# Patient Record
Sex: Male | Born: 1960 | Race: White | Hispanic: No | Marital: Married | Smoking: Never smoker
Health system: Southern US, Community
[De-identification: ages and names within clinical notes are randomized; demographics above are authoritative.]

## PROBLEM LIST (undated history)

## (undated) DIAGNOSIS — Z8719 Personal history of other diseases of the digestive system: Secondary | ICD-10-CM

## (undated) DIAGNOSIS — K219 Gastro-esophageal reflux disease without esophagitis: Secondary | ICD-10-CM

## (undated) DIAGNOSIS — T7840XA Allergy, unspecified, initial encounter: Secondary | ICD-10-CM

## (undated) DIAGNOSIS — G4730 Sleep apnea, unspecified: Secondary | ICD-10-CM

## (undated) HISTORY — DX: Allergy, unspecified, initial encounter: T78.40XA

---

## 1998-03-29 ENCOUNTER — Encounter: Admission: RE | Admit: 1998-03-29 | Discharge: 1998-06-27 | Payer: Self-pay | Admitting: Internal Medicine

## 1998-04-26 ENCOUNTER — Ambulatory Visit (HOSPITAL_COMMUNITY): Admission: RE | Admit: 1998-04-26 | Discharge: 1998-04-26 | Payer: Self-pay | Admitting: *Deleted

## 2000-05-02 ENCOUNTER — Encounter (INDEPENDENT_AMBULATORY_CARE_PROVIDER_SITE_OTHER): Payer: Self-pay | Admitting: Specialist

## 2000-05-02 ENCOUNTER — Encounter: Payer: Self-pay | Admitting: *Deleted

## 2000-05-02 ENCOUNTER — Ambulatory Visit (HOSPITAL_COMMUNITY): Admission: RE | Admit: 2000-05-02 | Discharge: 2000-05-02 | Payer: Self-pay | Admitting: *Deleted

## 2005-05-11 ENCOUNTER — Ambulatory Visit (HOSPITAL_BASED_OUTPATIENT_CLINIC_OR_DEPARTMENT_OTHER): Admission: RE | Admit: 2005-05-11 | Discharge: 2005-05-11 | Payer: Self-pay | Admitting: Family Medicine

## 2005-05-14 ENCOUNTER — Ambulatory Visit: Payer: Self-pay | Admitting: Internal Medicine

## 2005-07-17 ENCOUNTER — Ambulatory Visit (HOSPITAL_COMMUNITY): Admission: RE | Admit: 2005-07-17 | Discharge: 2005-07-17 | Payer: Self-pay | Admitting: *Deleted

## 2005-08-02 ENCOUNTER — Encounter: Admission: RE | Admit: 2005-08-02 | Discharge: 2005-08-02 | Payer: Self-pay | Admitting: *Deleted

## 2005-08-04 ENCOUNTER — Ambulatory Visit (HOSPITAL_COMMUNITY): Admission: RE | Admit: 2005-08-04 | Discharge: 2005-08-04 | Payer: Self-pay | Admitting: *Deleted

## 2005-08-17 ENCOUNTER — Ambulatory Visit (HOSPITAL_COMMUNITY): Admission: RE | Admit: 2005-08-17 | Discharge: 2005-08-17 | Payer: Self-pay | Admitting: Obstetrics and Gynecology

## 2005-08-18 ENCOUNTER — Encounter (INDEPENDENT_AMBULATORY_CARE_PROVIDER_SITE_OTHER): Payer: Self-pay | Admitting: *Deleted

## 2006-01-16 HISTORY — PX: LAPAROSCOPIC GASTRIC BANDING: SHX1100

## 2006-04-19 ENCOUNTER — Encounter: Admission: RE | Admit: 2006-04-19 | Discharge: 2006-07-18 | Payer: Self-pay | Admitting: *Deleted

## 2006-05-07 ENCOUNTER — Ambulatory Visit (HOSPITAL_COMMUNITY): Admission: RE | Admit: 2006-05-07 | Discharge: 2006-05-08 | Payer: Self-pay | Admitting: *Deleted

## 2007-12-25 ENCOUNTER — Encounter: Admission: RE | Admit: 2007-12-25 | Discharge: 2007-12-25 | Payer: Self-pay | Admitting: *Deleted

## 2009-10-22 ENCOUNTER — Encounter: Admission: RE | Admit: 2009-10-22 | Discharge: 2009-10-22 | Payer: Self-pay | Admitting: Surgery

## 2009-11-16 ENCOUNTER — Ambulatory Visit (HOSPITAL_COMMUNITY): Admission: RE | Admit: 2009-11-16 | Discharge: 2009-11-16 | Payer: Self-pay | Admitting: Surgery

## 2010-06-03 NOTE — Op Note (Signed)
[facility]  Patient:    Kerry Gomez, Kerry Gomez                      MRN: [id number] Proc. Date: 05/02/00 Adm. Date:  [id number] Attending:  Sabino Gasser                           Operative Report  PROCEDURE:  Upper endoscopy with dilation and biopsy.  INDICATIONS FOR PROCEDURE:  Dysphagia.  ANESTHESIA:  Demerol 80, Versed 8 mg.  DESCRIPTION OF PROCEDURE:  With the patient mildly sedated in the left lateral decubitus position, the Olympus videoscopic endoscope was inserted in the mouth, passed under direct vision through the esophagus which appeared normal. The distal esophagus showed a hiatal hernia, question of short segment Barretts esophagus seen, photographed and biopsied. We entered into the stomach, the fundus, body, antrum, duodenal bulb and second portion of the duodenum were all well visualized and appeared normal. From this point, the endoscope was slowly withdrawn taking circumferential views of the entire duodenal mucosa until the endoscope was then pulled back in the stomach and placed in retroflexion to view the stomach from below and this too appeared normal. The endoscope was then straightened and withdrawn after placing a guidewire under fluoroscopic control. Once the endoscope was withdrawn, Savary dilators of 16 and 19 were passed with some resistance on 19 mm dilator and a minimal amount of blood seen on the dilator upon removal. With this, the endoscope was reinserted. The stomach, body, antrum, and fundus all appeared normal. Photographs taken. The endoscope was withdrawn to the esophagus. The distal esophagus was biopsied in the area of question of Barretts and the endoscope was withdrawn taking circumferential views of the remaining esophageal mucosa which otherwise appeared normal. The patients vital signs and pulse oximeter remained stable. The patient tolerated the procedure well without apparent complications.  FINDINGS:  Question  of Barretts esophagus biopsied, dilation of distal esophagus to 19 Savary dilator.  PLAN:  Await clinical response. Continue Prilosec therapy at this time and have the patient followup with me in as an outpatient and call me for results of biopsies. DD:  05/02/00 TD:  05/02/00 Job: [id number]

## 2010-06-03 NOTE — Op Note (Signed)
NAMEGRABIEL, SCHMUTZ NO.:  [id number]   MEDICAL RECORD NO.:  [id number]          PATIENT TYPE:  OIB   LOCATION:  1610                         FACILITY:  [facility]   PHYSICIAN:  Alfonse Ras, MD   DATE OF BIRTH:  January 01, 1961   DATE OF PROCEDURE:  05/07/2006  DATE OF DISCHARGE:                               OPERATIVE REPORT   PREOPERATIVE DIAGNOSIS:  Morbid obesity, medically refractory.   POSTOPERATIVE DIAGNOSIS:  Morbid obesity, medically refractory.   PROCEDURE:  Laparoscopic adjustable gastric bending with APS system and  additional ren plication.   ANESTHESIA:  General.   SURGEON:  Baruch Merl, M.D.   ASSISTANT:  [name], M.D.   DESCRIPTION:  Patient was taken to the operating room, placed in the  supine position.  After adequate anesthesia was induced using  endotracheal tube, the abdomen was prepped and draped in normal sterile  fashion.  Using left upper quadrant access, through an 11 mm trocar  under direct vision peritoneal access was obtained.  Additional 12 and  15 mm trocars were placed in the right abdomen.  A 5 mm trocar was  placed in the subxiphoid region and the liver was retracted anteriorly.  The angle of His was dissected and using the band passer in a pars  flaccida technique it was passed behind the stomach.  An APS system was  primed and then placed and closed over a sizing tube.  Plication was  performed using #2-0 Ethibond sutures.  An additional two stitches were  placed in the fundus of the stomach.  Adequate hemostasis was ensured,  the band was in good position.  I then pulled the tubing out and it was  connected to the port and attached to the anterior abdominal fascia at  the rectus level.  Skin incisions were closed with subcuticular Vicryl  and then staples.  The patient tolerated procedure well and went to PACU  in good condition.      Alfonse Ras, MD  Electronically Signed     KRE/MEDQ  D:  05/07/2006   T:  05/07/2006  Job:  [id number]

## 2010-06-03 NOTE — Op Note (Signed)
Kerry Gomez, Kerry Gomez               ACCOUNT NO.:  [id number]   MEDICAL RECORD NO.:  [id number]          PATIENT TYPE:  AMB   LOCATION:  ENDO                         FACILITY:  [facility]   PHYSICIAN:  Sandria Bales. Ezzard Standing, M.D.  DATE OF BIRTH:  10-22-1960   DATE OF PROCEDURE:  08/17/2005  DATE OF DISCHARGE:                                 OPERATIVE REPORT   SURGEON:  Sandria Bales. Ezzard Standing, M.D.   PREOPERATIVE DIAGNOSIS:  History of reflux, anticipate Lap-Band procedure.   POSTOPERATIVE DIAGNOSIS:  Gastric polyp, approximately 1 cm in size; minimal  changes of reflux, with normal gastroesophageal junction at about 40 cm.   PROCEDURE:  Upper esophagogastroduodenoscopy.   ANESTHESIA:  Demerol 50 mg, Versed 4 mg.   COMPLICATIONS:  None.   INDICATIONS FOR PROCEDURE:  Kerry Gomez is a 50 year old white male, who comes  for upper endoscopy in anticipation of a Lap-Band procedure.  His wife has  actually had a successful Lap-Band with over 90 pounds of weight loss.   The indications and potential complications of the procedure were explained  to the patient.  Yesterday he had a prior upper endoscopy, so he understands  what goes on.   DESCRIPTION OF PROCEDURE:  The back of his throat was anesthetized with  Cetacaine spray 4 times.  I then gave him 50 mg Demerol, 4 mg Versed.  A  flexible Olympus GIF-140 endoscope was passed without difficulty down his  esophagus into his stomach.  I came into the duodenum to the second portion.  There were no ulcer and no inflammation.  He had normal mucosa of his first  and second portions of his duodenum.  His pylorus was normal with normal  opening pattern.  I retroflexed the scope back to his GE junction, which was  unremarkable.  At about 50 cm from the incisors along the greater curvature,  he had a 1 to 1.2-cm polyp, which was somewhat pedunculated.  I got the hot  biopsy forceps.  I coagulated this thing and took at last 3 biopsies.  I  sent it to  pathology.   There was no bleeding at the end of the biopsy of the polyp. I then withdrew  the scope to the GE junction, which was right at about 40 cm.  He had really  no evidence of distal esophagitis.  His Z line may be minimally irregular,  but he had minimal evidence of reflux.  He did, interestingly, have bile in  his stomach.  Again, I do not know what this meant in a person who is n.p.o.   He tolerated the procedure well.  I spoke to his wife and his parents after  the procedure.  He will be in touch with Dr. Colin Benton next week for final  followup of his pathology.      Sandria Bales. Ezzard Standing, M.D.  Electronically Signed     DHN/MEDQ  D:  08/17/2005  T:  08/18/2005  Job:  [id number]   cc:   Dr Marisue Humble Jake Shark, MD

## 2010-06-03 NOTE — Procedures (Signed)
Kerry Gomez, Kerry Gomez NO.:  [id number]   MEDICAL RECORD NO.:  [id number]          PATIENT TYPE:  OUT   LOCATION:  SLEEP CENTER                 FACILITY:  [facility]   PHYSICIAN:  Clinton D. Maple Hudson, M.D. DATE OF BIRTH:  Jun 04, 1960   DATE OF STUDY:                              NOCTURNAL POLYSOMNOGRAM   INDICATION FOR STUDY:  Hypersomnia with sleep apnea.   EPWORTH SLEEPINESS SCORE:  9/24.   BMI 38.  Weight 285 pounds.   MEDICATIONS:  AcipHex, Allegra, Flonase, Benadryl, aspirin.   SLEEP ARCHITECTURE:  Total sleep time 308 minutes with sleep efficiency 68%.  Stage I was 17%, stage II 73%, stages III and four were absent, REM 10% of  total sleep time.  Sleep latency 13 minutes, REM latency 274 minutes, awake  after sleep onset 115 minutes, arousal index 115 which is markedly increased  indicating significant sleep fragmentation.  No bedtime medication taken.   RESPIRATORY DATA:  Split study protocol.  Apnea/hypopnea index (AHI, RDI)  129.6 obstructive events per hour indicating severe obstructive sleep  apnea/hypopnea syndrome before CPAP.  This included 137 obstructive apneas  and 159 hypopneas before CPAP.  Events were not positional.  REM AHI 16.3  per hour.  CPAP was titrated to 10 CWP, AHI 1.8 per hour.  A medium ResMed  Ultra Mirage nasal oral mask was used with heated humidifier.   OXYGEN DATA:  Moderate snoring with oxygen desaturation to a nadir of 86%  before CPAP.  After CPAP control, saturation at 93-95% on room air.   CARDIAC DATA:  Normal sinus rhythm.   MOVEMENT-PARASOMNIA:  A total of 33 limb jerks were reported of which 25  were associated with arousal or awakening for a periodic limb movement with  arousal index of 4.9 per hour, which is mildly increased.   IMPRESSIONS-RECOMMENDATIONS:  1.  Very severe obstructive sleep apnea/hypopnea syndrome, AHI 1.29.6 per      hour with nonpositional events, moderate snoring and oxygen desaturation      to  86%.  2.  Successful CPAP titration to 10 CWP, AHI 1.8 per hour.A medium ResMed      Ultra Mirage nasal oral mask was      used with heated humidifier.  3.  Periodic limb movement with arousal, 4.9 per hour.      Clinton D. Maple Hudson, M.D.  Diplomate, Biomedical engineer of Sleep Medicine  Electronically Signed     CDY/MEDQ  D:  05/14/2005 12:56:45  T:  05/15/2005 11:09:32  Job:  [id number]

## 2010-09-02 ENCOUNTER — Encounter (INDEPENDENT_AMBULATORY_CARE_PROVIDER_SITE_OTHER): Payer: Self-pay

## 2010-09-02 ENCOUNTER — Ambulatory Visit (INDEPENDENT_AMBULATORY_CARE_PROVIDER_SITE_OTHER): Payer: 59 | Admitting: Physician Assistant

## 2010-09-02 VITALS — BP 128/86 | Ht 72.0 in | Wt 232.6 lb

## 2010-09-02 DIAGNOSIS — Z4651 Encounter for fitting and adjustment of gastric lap band: Secondary | ICD-10-CM

## 2010-09-02 DIAGNOSIS — K219 Gastro-esophageal reflux disease without esophagitis: Secondary | ICD-10-CM

## 2010-09-02 MED ORDER — PANTOPRAZOLE SODIUM 40 MG PO TBEC: 40.0000 mg | DELAYED_RELEASE_TABLET | Freq: Every day | ORAL | Status: DC

## 2010-09-02 NOTE — Patient Instructions (Signed)
Take Protonix as prescribed. Follow-up with Dr. Ezzard Standing as appointed. Return if worse or if symptoms persist.

## 2010-09-02 NOTE — Progress Notes (Signed)
  HISTORY: Kerry Gomez is a 50 y.o.male who received an AP-Standard lap-band in April 2008 by Dr. Ezzard Standing. He comes in today with a progressive one-month history of reflux symptoms, especially at night. He says he has to sleep on more than one pillow to help alleviate symptoms. He is taking prilosec OTC twice a day with little relief. He says his appetite is well-controlled and his portion sizes remain small.  VITAL SIGNS: Filed Vitals:   09/02/10 0950  BP: 128/86    PHYSICAL EXAM: Physical exam reveals a very well-appearing 50 y.o.male in no apparent distress Neurologic: Awake, alert, oriented Psych: Bright affect, conversant Respiratory: Breathing even and unlabored. No stridor or wheezing Abdomen: Soft, nontender, nondistended to palpation. Incisions well-healed. No incisional hernias. Port easily palpated. Extremities: Atraumatic, good range of motion.  ASSESMENT: 50 y.o.  male  s/p AP-Standard lap-band.   PLAN: We accessed his port and removed 0.25 mL, which is the same volume removed one year ago for similar symptoms. Hopefully this will alleviate his GERD. I've written a prescription for Protonix 40mg  daily for a month per his request. He's scheduled to see Dr. Ezzard Standing within the next two weeks. I asked him to return to see me next week if his symptoms persist or worsen. He voiced agreement.

## 2010-09-16 ENCOUNTER — Encounter (INDEPENDENT_AMBULATORY_CARE_PROVIDER_SITE_OTHER): Payer: Self-pay | Admitting: Surgery

## 2010-09-22 ENCOUNTER — Ambulatory Visit (INDEPENDENT_AMBULATORY_CARE_PROVIDER_SITE_OTHER): Payer: 59 | Admitting: Surgery

## 2010-09-22 VITALS — BP 134/90 | Ht 72.0 in | Wt 230.6 lb

## 2010-09-22 DIAGNOSIS — Z9884 Bariatric surgery status: Secondary | ICD-10-CM

## 2010-09-22 NOTE — Patient Instructions (Signed)
Watch carefully your portions.  See [place] back in 3 months.

## 2010-09-22 NOTE — Progress Notes (Signed)
ASSESSMENT AND PLAN: 1.  S/P lap band, AP standard.  Successful weight loss of about 60 pounds.  He'll see me or Mardelle Matte back in 3 months to see how he is doing.  Will have to be careful with the band fills because of the HH>  2.  Reflux symptoms.  Patient has a small to moderate HH associated with the band.  (Seen on the prior UGI's)  He has managed his symptoms with his diet and Protonix and we have decided to continue to observe this.  If he can not control his portions with the current anatomy or the reflux gets worse, we talked about revising his lap band and repairing his hiatal hernia.  HISTORY OF PRESENT ILLNESS:   Kerry Gomez is a 50 y.o. (DOB: 10-Oct-1960)  white male who is a patient of [facility] (the PA he was seeing has left) and comes to me today for lap band evaluation.   He received an AP-Standard lap-band in April 2008 by Dr. Colin Benton.  I have seen him in Dr. Tami Lin absence.  He saw Donalee Citrin on 09/02/2010 with a progressive one-month history of reflux symptoms, especially at night.  Mardelle Matte took 0.25 cc of fluid out of his lap band and put him on Protonix 40 mg daily.  The patient has done somewhat better since he saw [place].  He is a little concerned about the band being too loose.  I reviewed his chart, his prior UGI's, my prior 11/2009 endoscopy, and his history.  I spent some time explaining that the Hiatal hernia will cause some problems with getting the band fill right.  But if he can continue his current course, he ought to do well. The "last resort" would be to re-laparoscope him with the plan of fixing the [facility] and possibly resiting the band.  I talked a little about what that would involve.  PHYSICAL EXAM: BP 134/90  Ht 6' (1.829 m)  Wt 230 lb 9.6 oz (104.599 kg)  BMI 31.27 kg/m2  Lungs:  Clear to auscultation. Abdomen:  Port in RUQ.  No mass or tenderness.  DATA REVIEWED: UGI from 10/2009 and 12/2007.

## 2011-01-27 ENCOUNTER — Ambulatory Visit (INDEPENDENT_AMBULATORY_CARE_PROVIDER_SITE_OTHER): Payer: 59 | Admitting: Surgery

## 2011-01-27 ENCOUNTER — Encounter (INDEPENDENT_AMBULATORY_CARE_PROVIDER_SITE_OTHER): Payer: Self-pay | Admitting: Surgery

## 2011-01-27 VITALS — BP 120/82 | Ht 72.0 in | Wt 233.0 lb

## 2011-01-27 DIAGNOSIS — K449 Diaphragmatic hernia without obstruction or gangrene: Secondary | ICD-10-CM

## 2011-01-27 NOTE — Progress Notes (Addendum)
ASSESSMENT AND PLAN: 1.  S/P lap band, AP standard.  Successful weight loss of about 60 pounds.  The patient has been plagued with GERD, probably secondary to a moderate HH.  2.  Reflux symptoms with hiatal hernia.  Patient has a moderate HH associated with the band.  (Seen on the prior UGI's and endoscopy)  He has managed his symptoms with his diet and Protonix, but he has continued symptoms of GERD.  So, he is considering surgery to repair the [facility] and resite the lap band.  I talked to him about trying to manipulate the lap band, but he does not want fluid taken out.  In fact, he wants fluid added to the lap band.  So we will repeat an UGI since it has been  >1 year since his last one.  If the hernia is larger, then I would recommend surgery.  If the hernia is the same size, then we'll have to decide whether to go ahead with surgery vs. Continued medical management.  I gave him a book on GERD ([employer]), but pointed out the differences in the possible surgery.  He'll see me back in about 3 weeks, after the UGI.  [UGI 02/03/2011 shows more dilated esophagus.  The [facility] is not much changed.  But I think we will have to do two things:  1) give him a band holiday to let the esophagus return to normal size, 2) resite the band and fix the [facility], to hopefully prevent future esophageal dilatation.  I am unsure of how long to give the band holiday before the surgery (3 months?).  Will have to discuss with the patient.  DN  02/06/2011]  HISTORY OF PRESENT ILLNESS:  Kerry Gomez is a 51 y.o. (DOB: 18-Jan-1960)  white male who is a patient of [facility] (the PA he was seeing has left) and comes to me today for lap band evaluation.   He received an AP-Standard lap-band in April 2008 by Dr. Colin Benton.  I have seen him in Dr. Tami Lin absence.  He saw Donalee Citrin on 09/02/2010 with a progressive one-month history of reflux symptoms, especially at night.  Kerry Gomez took 0.25 cc of fluid out of his lap band and put him on  Protonix 40 mg daily.  The patient has done somewhat better since he saw [place].  He is a little concerned about the band being too loose.  I reviewed his chart, his prior UGI's (last UGI was 10/22/2009), my prior 11/2009 endoscopy, and his history.    The reflux continues to bother him and he wants to consider surgery.   I talked to him about trying to manipulate the lap band, but he does not want fluid taken out.  In fact, he wants fluid added to the lap band.  So, we will repeat an UGI since it has been  >1 year since his last exam.  If the hernia is larger, then I would recommend surgery.  If the hernia is the same size, then we'll have to decide whether to go ahead with surgery vs. Continued medical management.  I gave him a book on GERD ([employer]), but pointed out the differences in the possible surgery.  PHYSICAL EXAM: BP 120/82  Ht 6' (1.829 m)  Wt 233 lb (105.688 kg)  BMI 31.60 kg/m2  Lungs:  Clear to auscultation. Abdomen:  Port in RUQ.  No mass or tenderness.  No hernia.  DATA REVIEWED: UGI from 10/2009 and 12/2007.

## 2011-02-03 ENCOUNTER — Ambulatory Visit
Admission: RE | Admit: 2011-02-03 | Discharge: 2011-02-03 | Disposition: A | Discharge status: Home / Self Care | Payer: Self-pay | Source: Ambulatory Visit | Attending: Surgery | Admitting: Surgery

## 2011-02-03 DIAGNOSIS — K449 Diaphragmatic hernia without obstruction or gangrene: Secondary | ICD-10-CM

## 2011-02-17 ENCOUNTER — Ambulatory Visit (INDEPENDENT_AMBULATORY_CARE_PROVIDER_SITE_OTHER): Payer: 59 | Admitting: Surgery

## 2011-02-17 ENCOUNTER — Encounter (INDEPENDENT_AMBULATORY_CARE_PROVIDER_SITE_OTHER): Payer: Self-pay | Admitting: Surgery

## 2011-02-17 DIAGNOSIS — Z9884 Bariatric surgery status: Secondary | ICD-10-CM

## 2011-02-17 DIAGNOSIS — K449 Diaphragmatic hernia without obstruction or gangrene: Secondary | ICD-10-CM

## 2011-02-17 NOTE — Progress Notes (Signed)
ASSESSMENT AND PLAN: 1.  S/P lap band, AP standard.  Successful weight loss of about 60 pounds.  Initial weight - 282, BMI 38.2.  The patient has been plagued with GERD, probably secondary to a moderate HH.  2.  Reflux symptoms with hiatal hernia.  He has managed his symptoms with his diet and Protonix, but he has continued symptoms of GERD.    UGI 02/03/2011 shows more dilated esophagus.  The [facility] is not much changed.  I discussed with him 2 things:  1) give him a band holiday to let the esophagus return to normal size, 2) resite the band and fix the [facility], to hopefully prevent future esophageal dilatation.   I removed 3.5 cc today from band (all the fluid).  I will see him back in 8 weeks.  Plan revision of lap band and HH repair in 10-12 weeks.  I gave him a copy of the UGI.  I drew a picture for him of the findings.  I discussed the risks, which include, bleeding, infection, failure to be able to resite the band, bowel injury, and recurrence of the [facility].   HISTORY OF PRESENT ILLNESS:  Kerry Gomez is a 51 y.o. (DOB: 1960/10/13)  white male who is a patient of [facility] (the PA he was seeing has left) and comes to me today for lap band evaluation.    He received an AP-Standard lap-band in May 07, 2006 by Dr. Colin Benton.  I have seen him in Dr. Tami Lin absence.   He has a HH with increasing reflux we have tried to manage both medically and by adjusting the lap band.   The reflux continues to bother him.  I repeated the UGI on 02/03/2011 which shows more dilated esophagus.  The [facility] is not much changed. I think this pushes [place] to revise the lap band and try to fix the [facility].   I gave him a book on GERD ([employer]) at the last visit.  He did put the head of his bed up, which helped a little.  I pointed out that a Nissen is different that resiting the lap band.  PHYSICAL EXAM: BP 142/88  Pulse 78  Temp(Src) 97 F (36.1 C) (Temporal)  Resp 18  Ht 6' (1.829 m)  Wt 233 lb 8 oz (105.915 kg)  BMI 31.67  kg/m2  Lungs:  Clear to auscultation. Abdomen:  Port in RUQ.  No mass or tenderness.  No hernia.  Procedure:  While in the office, I access his lap band.  I removed 3.5 cc from the lap band.  DATA REVIEWED: UGI from 10/2009 and 12/2007.

## 2011-04-13 ENCOUNTER — Ambulatory Visit (INDEPENDENT_AMBULATORY_CARE_PROVIDER_SITE_OTHER): Payer: 59 | Admitting: Surgery

## 2011-04-13 VITALS — BP 128/88 | HR 90 | Temp 98.4°F | Resp 18 | Ht 72.0 in | Wt 235.0 lb

## 2011-04-13 DIAGNOSIS — K449 Diaphragmatic hernia without obstruction or gangrene: Secondary | ICD-10-CM

## 2011-04-13 DIAGNOSIS — Z9884 Bariatric surgery status: Secondary | ICD-10-CM

## 2011-04-13 NOTE — Progress Notes (Signed)
ASSESSMENT AND PLAN: 1.  S/P lap band, AP standard.  Successful weight loss of about 60 pounds.  Initial weight - 282, BMI 38.2.  The patient has been plagued with GERD, probably secondary to a moderate HH above the lap band.  2.  Reflux symptoms with hiatal hernia.  UGI 02/03/2011 shows a more dilated esophagus.  The [facility] is not much changed.  At the 02/17/2011 visit I discussed with him 2 things:  1) give him a band holiday to let the esophagus return to normal size, 2) resite the band and fix the [facility], to hopefully prevent future esophageal dilatation.   I removed 3.5 cc today from band (all the fluid) on 02/17/2011.  He is doing much better with the fluid out of the band.    I have scheduled him for [facility] repair and re-siting the lap band on 05/16/2011.  I had reviewed the surgery with him before and I went over it again.  I discussed the risks, which include, bleeding, infection, failure to be able to resite the band, bowel injury, and recurrence of the [facility].  I thinks I answered all the questions about the surgery and the plan.   HISTORY OF PRESENT ILLNESS:  Kerry Gomez is a 51 y.o. (DOB: [birth year])  white male who is a patient of [facility] (the PA he was seeing has left) and comes to me today for lap band follow up.   He received an AP-Standard lap-band in May 07, 2006 by Dr. Colin Benton.  I have seen him in Dr. Tami Lin absence.    I did an upper endo 11/16/2009.  He has a HH with increasing reflux we have tried to manage both medically and by adjusting the lap band.   The reflux continues to bother him.  I repeated the UGI on 02/03/2011 which shows more dilated esophagus.  The [facility] is not much changed. I think this pushes [place] to revise the lap band and try to fix the [facility].   I gave him a book on GERD ([employer]) at the last visit.  He did put the head of his bed up, which helped a little.  I pointed out that a Nissen is different that resiting the lap band.  I took all the fluid out of his lap band on  02/17/2011 and he is doing much better.  He is scheduled for [facility] repair and revision of Lap Band on 05/16/2011.  Today's visit was to make sure his reflux was better and to go over the surgery.  ROS: No significant cardiac, pulmonary, urologic, or musculoskeletal issues.  PHYSICAL EXAM: BP 128/88  Pulse 90  Temp(Src) 98.4 F (36.9 C) (Temporal)  Resp 18  Ht 6' (1.829 m)  Wt 235 lb (106.595 kg)  BMI 31.87 kg/m2  General: [name] who is alert and generally healthy appearing.  HEENT: Normal. Pupils equal. Good dentition. Neck: Supple. No mass.  No thyroid mass.  Carotid pulse okay with no bruit. Lymph Nodes:  No supraclavicular or cervical nodes. Lungs: Clear to auscultation and symmetric breath sounds. Heart:  RRR. No murmur or rub.  Abdomen: Soft. No mass. No tenderness. No hernia. Normal bowel sounds.  Lap band port in RUQ.  He is happy with port placement and I told him that I do not plan to change the port. Rectal: Not done. Extremities:  Good strength and ROM  in upper and lower extremities. Neurologic:  Grossly intact to motor and sensory function. Psychiatric: Has  normal mood and affect. Behavior is normal.   DATA REVIEWED: UGI from 10/2009 and 12/2007.  [name], MD, [facility] Surgery Pager: [phone] Office phone:  [phone]

## 2011-05-01 ENCOUNTER — Other Ambulatory Visit (INDEPENDENT_AMBULATORY_CARE_PROVIDER_SITE_OTHER): Payer: Self-pay | Admitting: Surgery

## 2011-05-02 ENCOUNTER — Encounter (HOSPITAL_COMMUNITY): Payer: Self-pay | Admitting: Pharmacy Technician

## 2011-05-04 HISTORY — PX: TONSILLECTOMY: SUR1361

## 2011-05-05 ENCOUNTER — Encounter (HOSPITAL_COMMUNITY): Payer: Self-pay

## 2011-05-05 ENCOUNTER — Encounter (HOSPITAL_COMMUNITY)
Admission: RE | Admit: 2011-05-05 | Discharge: 2011-05-05 | Disposition: A | Discharge status: Home / Self Care | Payer: 59 | Source: Ambulatory Visit | Attending: Surgery | Admitting: Surgery

## 2011-05-05 DIAGNOSIS — Z01812 Encounter for preprocedural laboratory examination: Secondary | ICD-10-CM | POA: Insufficient documentation

## 2011-05-05 DIAGNOSIS — K449 Diaphragmatic hernia without obstruction or gangrene: Secondary | ICD-10-CM | POA: Insufficient documentation

## 2011-05-05 DIAGNOSIS — G4730 Sleep apnea, unspecified: Secondary | ICD-10-CM

## 2011-05-05 DIAGNOSIS — Z8719 Personal history of other diseases of the digestive system: Secondary | ICD-10-CM

## 2011-05-05 DIAGNOSIS — K219 Gastro-esophageal reflux disease without esophagitis: Secondary | ICD-10-CM

## 2011-05-05 HISTORY — DX: Sleep apnea, unspecified: G47.30

## 2011-05-05 HISTORY — PX: NASAL/SINUS ENDOSCOPY: SHX288

## 2011-05-05 HISTORY — DX: Personal history of other diseases of the digestive system: Z87.19

## 2011-05-05 HISTORY — DX: Gastro-esophageal reflux disease without esophagitis: K21.9

## 2011-05-05 HISTORY — PX: REFRACTIVE SURGERY: SHX103

## 2011-05-05 LAB — SURGICAL PCR SCREEN: Staphylococcus aureus: NEGATIVE

## 2011-05-05 MED ORDER — CHLORHEXIDINE GLUCONATE 4 % EX LIQD
1.0000 "application " | Freq: Once | CUTANEOUS | Status: DC
  Filled 2011-05-05: qty 15

## 2011-05-05 NOTE — Pre-Procedure Instructions (Addendum)
05-05-11 0915 -no labs, EKG/ CXR  Done per anesthesia guidelines. W. Kennon Portela

## 2011-05-05 NOTE — Patient Instructions (Signed)
20 Kerry Gomez  05/05/2011   Your procedure is scheduled on:  4-30 -2013  Report to [facility] at    0530    AM .  Call this number if you have problems the morning of surgery: [phone]   Remember:   Do not eat food:After Midnight.  .  Take these medicines the morning of surgery with A SIP OF WATER: Allegra, use Flonase nasal spray and bring   Do not wear jewelry, make-up or nail polish.  Do not wear lotions, powders, or perfumes. You may wear deodorant.  Do not shave 48 hours prior to surgery.(face and neck okay, no shaving of legs)  Do not bring valuables to the hospital.  Contacts, dentures or bridgework may not be worn into surgery.  Leave suitcase in the car. After surgery it may be brought to your room.  For patients admitted to the hospital, checkout time is 11:00 AM the day of discharge.   Patients discharged the day of surgery will not be allowed to drive home.  Name and phone number of your driver:  spouse Special Instructions: CHG Shower Use Special Wash: 1/2 bottle night before surgery and 1/2 bottle morning of surgery.(avoid face and genitals)   Please read over the following fact sheets that you were given: MRSA Information.

## 2011-05-16 ENCOUNTER — Encounter (HOSPITAL_COMMUNITY): Payer: Self-pay | Admitting: Anesthesiology

## 2011-05-16 ENCOUNTER — Encounter (HOSPITAL_COMMUNITY)
Admission: RE | Disposition: A | Discharge status: Home / Self Care | Payer: Self-pay | Source: Ambulatory Visit | Attending: Surgery

## 2011-05-16 ENCOUNTER — Encounter (HOSPITAL_COMMUNITY): Payer: Self-pay | Admitting: *Deleted

## 2011-05-16 ENCOUNTER — Ambulatory Visit (HOSPITAL_COMMUNITY)
Admission: RE | Admit: 2011-05-16 | Discharge: 2011-05-16 | Disposition: A | Discharge status: Home / Self Care | Payer: 59 | Source: Ambulatory Visit | Attending: Surgery | Admitting: Surgery

## 2011-05-16 DIAGNOSIS — K449 Diaphragmatic hernia without obstruction or gangrene: Secondary | ICD-10-CM | POA: Insufficient documentation

## 2011-05-16 DIAGNOSIS — Z9884 Bariatric surgery status: Secondary | ICD-10-CM | POA: Insufficient documentation

## 2011-05-16 SURGERY — REPAIR, HERNIA, HIATAL
Anesthesia: General | Wound class: Clean

## 2011-05-16 MED ORDER — CEFAZOLIN SODIUM-DEXTROSE 2-3 GM-% IV SOLR: 2.0000 g | INTRAVENOUS | Status: DC

## 2011-05-16 MED ORDER — HEPARIN SODIUM (PORCINE) 5000 UNIT/ML IJ SOLN: 5000.0000 [IU] | Freq: Once | INTRAMUSCULAR | Status: DC

## 2011-05-16 MED ORDER — HEPARIN SODIUM (PORCINE) 5000 UNIT/ML IJ SOLN
INTRAMUSCULAR | Status: AC
  Administered 2011-05-16: 5000 [IU] via SUBCUTANEOUS
  Filled 2011-05-16: qty 1

## 2011-05-16 SURGICAL SUPPLY — 56 items
ADH SKN CLS APL DERMABOND .7 (GAUZE/BANDAGES/DRESSINGS)
BLADE HEX COATED 2.75 (ELECTRODE) ×1 IMPLANT
BLADE SURG 15 STRL LF DISP TIS (BLADE) IMPLANT
BLADE SURG 15 STRL SS (BLADE)
BLADE SURG SZ11 CARB STEEL (BLADE) ×1 IMPLANT
CABLE HIGH FREQUENCY MONO STRZ (ELECTRODE) ×1 IMPLANT
CANISTER SUCTION 2500CC (MISCELLANEOUS) ×1 IMPLANT
CLOTH BEACON ORANGE TIMEOUT ST (SAFETY) ×1 IMPLANT
DECANTER SPIKE VIAL GLASS SM (MISCELLANEOUS) ×2 IMPLANT
DERMABOND ADVANCED (GAUZE/BANDAGES/DRESSINGS)
DERMABOND ADVANCED .7 DNX12 (GAUZE/BANDAGES/DRESSINGS) ×1 IMPLANT
DEVICE SUT QUICK LOAD TK 5 (STAPLE) ×3 IMPLANT
DEVICE SUT TI-KNOT TK 5X26 (MISCELLANEOUS) ×1 IMPLANT
DEVICE SUTURE ENDOST 10MM (ENDOMECHANICALS) IMPLANT
DRAPE CAMERA CLOSED 9X96 (DRAPES) ×1 IMPLANT
ELECT REM PT RETURN 9FT ADLT (ELECTROSURGICAL)
ELECTRODE REM PT RTRN 9FT ADLT (ELECTROSURGICAL) ×1 IMPLANT
GLOVE BIOGEL PI IND STRL 7.0 (GLOVE) ×1 IMPLANT
GLOVE BIOGEL PI INDICATOR 7.0 (GLOVE)
GLOVE SS BIOGEL STRL SZ 7.5 (GLOVE) ×1 IMPLANT
GLOVE SUPERSENSE BIOGEL SZ 7.5 (GLOVE)
GOWN STRL NON-REIN LRG LVL3 (GOWN DISPOSABLE) ×1 IMPLANT
GOWN STRL REIN XL XLG (GOWN DISPOSABLE) ×2 IMPLANT
HOVERMATT SINGLE USE (MISCELLANEOUS) ×1 IMPLANT
KIT BASIN OR (CUSTOM PROCEDURE TRAY) ×1 IMPLANT
NDL SPNL 22GX3.5 QUINCKE BK (NEEDLE) ×1 IMPLANT
NEEDLE SPNL 22GX3.5 QUINCKE BK (NEEDLE) IMPLANT
NS IRRIG 1000ML POUR BTL (IV SOLUTION) ×1 IMPLANT
PACK UNIVERSAL I (CUSTOM PROCEDURE TRAY) ×1 IMPLANT
PENCIL BUTTON HOLSTER BLD 10FT (ELECTRODE) ×1 IMPLANT
SCALPEL HARMONIC ACE (MISCELLANEOUS) IMPLANT
SET IRRIG TUBING LAPAROSCOPIC (IRRIGATION / IRRIGATOR) IMPLANT
SLEEVE ADV FIXATION 5X100MM (TROCAR) ×1 IMPLANT
SOLUTION ANTI FOG 6CC (MISCELLANEOUS) ×1 IMPLANT
SPONGE LAP 18X18 X RAY DECT (DISPOSABLE) ×1 IMPLANT
STAPLER VISISTAT 35W (STAPLE) ×1 IMPLANT
SUT ETHIBOND 2 0 SH (SUTURE)
SUT ETHIBOND 2 0 SH 36X2 (SUTURE) ×3 IMPLANT
SUT MNCRL AB 4-0 PS2 18 (SUTURE) ×1 IMPLANT
SUT PROLENE 2 0 CT2 30 (SUTURE) ×1 IMPLANT
SUT SILK 0 (SUTURE)
SUT SILK 0 30XBRD TIE 6 (SUTURE) ×1 IMPLANT
SUT SURGIDAC NAB ES-9 0 48 120 (SUTURE) IMPLANT
SUT VIC AB 2-0 SH 27 (SUTURE)
SUT VIC AB 2-0 SH 27X BRD (SUTURE) ×1 IMPLANT
SYR 20CC LL (SYRINGE) ×1 IMPLANT
SYR CONTROL 10ML LL (SYRINGE) ×1 IMPLANT
SYS KII OPTICAL ACCESS 15MM (TROCAR)
SYSTEM KII OPTICAL ACCESS 15MM (TROCAR) ×1 IMPLANT
TOWEL OR 17X26 10 PK STRL BLUE (TOWEL DISPOSABLE) ×1 IMPLANT
TROCAR ADV FIXATION 11X100MM (TROCAR) IMPLANT
TROCAR BLADELESS OPT 5 100 (ENDOMECHANICALS) ×1 IMPLANT
TROCAR XCEL NON-BLD 11X100MML (ENDOMECHANICALS) IMPLANT
TROCAR Z-THREAD FIOS 5X100MM (TROCAR) ×1 IMPLANT
TUBE CALIBRATION LAPBAND (TUBING) ×3 IMPLANT
TUBING INSUFFLATION 10FT LAP (TUBING) ×1 IMPLANT

## 2011-05-16 NOTE — Progress Notes (Signed)
Dr. Ezzard Standing called to talk to patient and inform him that he is sick and is going to cancel the patient's surgery for today. Patient instructed to call the office today to reschedule

## 2011-06-07 ENCOUNTER — Encounter (INDEPENDENT_AMBULATORY_CARE_PROVIDER_SITE_OTHER): Payer: 59 | Admitting: Surgery

## 2011-07-06 ENCOUNTER — Encounter (HOSPITAL_COMMUNITY): Payer: Self-pay | Admitting: Pharmacy Technician

## 2011-07-11 ENCOUNTER — Other Ambulatory Visit (INDEPENDENT_AMBULATORY_CARE_PROVIDER_SITE_OTHER): Payer: Self-pay | Admitting: Surgery

## 2011-07-14 ENCOUNTER — Encounter (HOSPITAL_COMMUNITY)
Admission: RE | Admit: 2011-07-14 | Discharge: 2011-07-14 | Disposition: A | Discharge status: Home / Self Care | Payer: 59 | Source: Ambulatory Visit | Attending: Surgery | Admitting: Surgery

## 2011-07-14 ENCOUNTER — Encounter (HOSPITAL_COMMUNITY): Payer: Self-pay

## 2011-07-14 LAB — CBC
HCT: 46.4 % (ref 39.0–52.0)
Hemoglobin: 16 g/dL (ref 13.0–17.0)
MCHC: 34.5 g/dL (ref 30.0–36.0)
RDW: 12.4 % (ref 11.5–15.5)
WBC: 9.4 10*3/uL (ref 4.0–10.5)

## 2011-07-14 LAB — SURGICAL PCR SCREEN
MRSA, PCR: NEGATIVE
Staphylococcus aureus: NEGATIVE

## 2011-07-14 MED ORDER — CHLORHEXIDINE GLUCONATE 4 % EX LIQD
1.0000 "application " | Freq: Once | CUTANEOUS | Status: DC
  Filled 2011-07-14: qty 15

## 2011-07-14 NOTE — Patient Instructions (Signed)
20 ILYAAS MUSTO  07/14/2011   Your procedure is scheduled on:  07/17/11 7:15 AM  Report to SHORT STAY DEPT  at 5:15  AM.  Call this number if you have problems the morning of surgery: [phone]   Remember:   Do not eat food or drink liquids AFTER MIDNIGHT    Take these medicines the morning of surgery with A SIP OF WATER: ALLEGRA / FLONASE NASAL SPRAY   Do not wear jewelry, make-up or nail polish.  Do not wear lotions, powders, or perfumes.   Do not shave legs or underarms 48 hrs. before surgery (men may shave face)  Do not bring valuables to the hospital.  Contacts, dentures or bridgework may not be worn into surgery.  Leave suitcase in the car. After surgery it may be brought to your room.  For patients admitted to the hospital, checkout time is 11:00 AM the day of discharge.   Patients discharged the day of surgery will not be allowed to drive home.    Special Instructions:   Please read over the following fact sheets that you were given: MRSA  Information               SHOWER WITH BETASEPT THE NIGHT BEFORE SURGERY AND THE MORNING OF SURGERY               STOP ALL ASPIRIN / HERBAL MEDS AND IBUPROFEN / ALEVE / MOTRIN 5 DAYS PREOP

## 2011-07-17 ENCOUNTER — Encounter (HOSPITAL_COMMUNITY)
Admission: RE | Disposition: A | Discharge status: Home / Self Care | Payer: Self-pay | Source: Ambulatory Visit | Attending: Surgery

## 2011-07-17 ENCOUNTER — Encounter (HOSPITAL_COMMUNITY): Payer: Self-pay | Admitting: Anesthesiology

## 2011-07-17 ENCOUNTER — Ambulatory Visit (HOSPITAL_COMMUNITY): Payer: 59 | Admitting: Anesthesiology

## 2011-07-17 ENCOUNTER — Ambulatory Visit (HOSPITAL_COMMUNITY)
Admission: RE | Admit: 2011-07-17 | Discharge: 2011-07-18 | Disposition: A | Discharge status: Home / Self Care | Payer: 59 | Source: Ambulatory Visit | Attending: Surgery | Admitting: Surgery

## 2011-07-17 ENCOUNTER — Encounter (HOSPITAL_COMMUNITY): Payer: Self-pay

## 2011-07-17 DIAGNOSIS — T85898A Other specified complication of other internal prosthetic devices, implants and grafts, initial encounter: Secondary | ICD-10-CM

## 2011-07-17 DIAGNOSIS — K449 Diaphragmatic hernia without obstruction or gangrene: Secondary | ICD-10-CM

## 2011-07-17 DIAGNOSIS — Z9884 Bariatric surgery status: Secondary | ICD-10-CM

## 2011-07-17 DIAGNOSIS — Z4651 Encounter for fitting and adjustment of gastric lap band: Secondary | ICD-10-CM | POA: Insufficient documentation

## 2011-07-17 DIAGNOSIS — K9509 Other complications of gastric band procedure: Secondary | ICD-10-CM | POA: Insufficient documentation

## 2011-07-17 DIAGNOSIS — K219 Gastro-esophageal reflux disease without esophagitis: Secondary | ICD-10-CM | POA: Insufficient documentation

## 2011-07-17 DIAGNOSIS — Y838 Other surgical procedures as the cause of abnormal reaction of the patient, or of later complication, without mention of misadventure at the time of the procedure: Secondary | ICD-10-CM | POA: Insufficient documentation

## 2011-07-17 DIAGNOSIS — Z01812 Encounter for preprocedural laboratory examination: Secondary | ICD-10-CM | POA: Insufficient documentation

## 2011-07-17 HISTORY — PX: HIATAL HERNIA REPAIR: SHX195

## 2011-07-17 SURGERY — REVISION, GASTRIC BAND, LAPAROSCOPIC
Anesthesia: General | Site: Esophagus | Wound class: Clean

## 2011-07-17 MED ORDER — PANTOPRAZOLE SODIUM 40 MG PO TBEC
40.0000 mg | DELAYED_RELEASE_TABLET | Freq: Every evening | ORAL | Status: DC
  Administered 2011-07-17: 40 mg via ORAL
  Filled 2011-07-17 (×2): qty 1

## 2011-07-17 MED ORDER — PROPOFOL 10 MG/ML IV BOLUS
INTRAVENOUS | Status: DC | PRN
  Administered 2011-07-17: 150 mg via INTRAVENOUS

## 2011-07-17 MED ORDER — BUPIVACAINE HCL (PF) 0.5 % IJ SOLN
INTRAMUSCULAR | Status: AC
  Filled 2011-07-17: qty 30

## 2011-07-17 MED ORDER — LACTATED RINGERS IV SOLN
INTRAVENOUS | Status: DC | PRN
  Administered 2011-07-17 (×2): via INTRAVENOUS

## 2011-07-17 MED ORDER — UNJURY CHOCOLATE CLASSIC POWDER: 2.0000 [oz_av] | Freq: Four times a day (QID) | ORAL | Status: DC

## 2011-07-17 MED ORDER — POTASSIUM CHLORIDE IN NACL 20-0.45 MEQ/L-% IV SOLN
INTRAVENOUS | Status: DC
  Administered 2011-07-17: 13:00:00 via INTRAVENOUS
  Administered 2011-07-17 – 2011-07-18 (×2): 125 mL via INTRAVENOUS
  Filled 2011-07-17 (×6): qty 1000

## 2011-07-17 MED ORDER — BUPIVACAINE-EPINEPHRINE 0.5% -1:200000 IJ SOLN
INTRAMUSCULAR | Status: AC
  Filled 2011-07-17: qty 1

## 2011-07-17 MED ORDER — SUCCINYLCHOLINE CHLORIDE 20 MG/ML IJ SOLN
INTRAMUSCULAR | Status: DC | PRN
  Administered 2011-07-17: 100 mg via INTRAVENOUS

## 2011-07-17 MED ORDER — ACETAMINOPHEN 10 MG/ML IV SOLN
1000.0000 mg | Freq: Four times a day (QID) | INTRAVENOUS | Status: AC
  Administered 2011-07-17 – 2011-07-18 (×4): 1000 mg via INTRAVENOUS
  Filled 2011-07-17 (×6): qty 100

## 2011-07-17 MED ORDER — ACETAMINOPHEN 160 MG/5ML PO SOLN: 650.0000 mg | ORAL | Status: DC | PRN

## 2011-07-17 MED ORDER — FENTANYL CITRATE 0.05 MG/ML IJ SOLN
INTRAMUSCULAR | Status: DC | PRN
  Administered 2011-07-17 (×2): 50 ug via INTRAVENOUS
  Administered 2011-07-17: 100 ug via INTRAVENOUS
  Administered 2011-07-17: 50 ug via INTRAVENOUS
  Administered 2011-07-17 (×2): 100 ug via INTRAVENOUS
  Administered 2011-07-17: 50 ug via INTRAVENOUS

## 2011-07-17 MED ORDER — HEPARIN SODIUM (PORCINE) 5000 UNIT/ML IJ SOLN
5000.0000 [IU] | Freq: Once | INTRAMUSCULAR | Status: AC
  Administered 2011-07-17: 5000 [IU] via SUBCUTANEOUS

## 2011-07-17 MED ORDER — MORPHINE SULFATE 2 MG/ML IJ SOLN
2.0000 mg | INTRAMUSCULAR | Status: DC | PRN
  Administered 2011-07-17 (×2): 2 mg via INTRAVENOUS
  Filled 2011-07-17: qty 1

## 2011-07-17 MED ORDER — UNJURY CHICKEN SOUP POWDER: 2.0000 [oz_av] | Freq: Four times a day (QID) | ORAL | Status: DC

## 2011-07-17 MED ORDER — HEPARIN SODIUM (PORCINE) 5000 UNIT/ML IJ SOLN
5000.0000 [IU] | Freq: Three times a day (TID) | INTRAMUSCULAR | Status: DC
  Administered 2011-07-17 – 2011-07-18 (×2): 5000 [IU] via SUBCUTANEOUS
  Filled 2011-07-17 (×5): qty 1

## 2011-07-17 MED ORDER — HEPARIN SODIUM (PORCINE) 5000 UNIT/ML IJ SOLN
INTRAMUSCULAR | Status: AC
  Administered 2011-07-17: 5000 [IU] via SUBCUTANEOUS
  Filled 2011-07-17: qty 1

## 2011-07-17 MED ORDER — LACTATED RINGERS IR SOLN
Status: DC | PRN
  Administered 2011-07-17: 3000 mL

## 2011-07-17 MED ORDER — OXYCODONE-ACETAMINOPHEN 5-325 MG/5ML PO SOLN: 5.0000 mL | ORAL | Status: DC | PRN

## 2011-07-17 MED ORDER — CISATRACURIUM BESYLATE (PF) 10 MG/5ML IV SOLN
INTRAVENOUS | Status: DC | PRN
  Administered 2011-07-17: 6 mg via INTRAVENOUS
  Administered 2011-07-17 (×2): 4 mg via INTRAVENOUS
  Administered 2011-07-17 (×2): 6 mg via INTRAVENOUS

## 2011-07-17 MED ORDER — FLUTICASONE PROPIONATE 50 MCG/ACT NA SUSP
2.0000 | Freq: Every day | NASAL | Status: DC
  Administered 2011-07-18: 2 via NASAL
  Filled 2011-07-17: qty 16

## 2011-07-17 MED ORDER — LORATADINE 10 MG PO TABS
10.0000 mg | ORAL_TABLET | Freq: Every day | ORAL | Status: DC
  Administered 2011-07-18: 10 mg via ORAL
  Filled 2011-07-17: qty 1

## 2011-07-17 MED ORDER — HYDROMORPHONE HCL PF 1 MG/ML IJ SOLN: 0.2500 mg | INTRAMUSCULAR | Status: DC | PRN

## 2011-07-17 MED ORDER — BUPIVACAINE HCL (PF) 0.5 % IJ SOLN
INTRAMUSCULAR | Status: DC | PRN
  Administered 2011-07-17: 30 mL

## 2011-07-17 MED ORDER — NEOSTIGMINE METHYLSULFATE 1 MG/ML IJ SOLN
INTRAMUSCULAR | Status: DC | PRN
  Administered 2011-07-17: 4 mg via INTRAVENOUS

## 2011-07-17 MED ORDER — CEFAZOLIN SODIUM-DEXTROSE 2-3 GM-% IV SOLR
2.0000 g | Freq: Once | INTRAVENOUS | Status: AC
  Administered 2011-07-17: 2 g via INTRAVENOUS

## 2011-07-17 MED ORDER — UNJURY VANILLA POWDER: 2.0000 [oz_av] | Freq: Four times a day (QID) | ORAL | Status: DC

## 2011-07-17 MED ORDER — CEFAZOLIN SODIUM 1-5 GM-% IV SOLN
INTRAVENOUS | Status: AC
  Filled 2011-07-17: qty 50

## 2011-07-17 MED ORDER — ONDANSETRON HCL 4 MG/2ML IJ SOLN
INTRAMUSCULAR | Status: DC | PRN
  Administered 2011-07-17: 4 mg via INTRAVENOUS

## 2011-07-17 MED ORDER — DEXAMETHASONE SODIUM PHOSPHATE 10 MG/ML IJ SOLN
INTRAMUSCULAR | Status: DC | PRN
  Administered 2011-07-17: 10 mg via INTRAVENOUS

## 2011-07-17 MED ORDER — MIDAZOLAM HCL 5 MG/5ML IJ SOLN
INTRAMUSCULAR | Status: DC | PRN
  Administered 2011-07-17: 2 mg via INTRAVENOUS

## 2011-07-17 MED ORDER — GLYCOPYRROLATE 0.2 MG/ML IJ SOLN
INTRAMUSCULAR | Status: DC | PRN
  Administered 2011-07-17: .5 mg via INTRAVENOUS

## 2011-07-17 MED ORDER — MORPHINE SULFATE 2 MG/ML IJ SOLN
INTRAMUSCULAR | Status: AC
  Filled 2011-07-17: qty 1

## 2011-07-17 MED ORDER — ONDANSETRON HCL 4 MG/2ML IJ SOLN
4.0000 mg | INTRAMUSCULAR | Status: DC | PRN
  Administered 2011-07-17 (×2): 4 mg via INTRAVENOUS
  Filled 2011-07-17 (×2): qty 2

## 2011-07-17 SURGICAL SUPPLY — 59 items
ADH SKN CLS APL DERMABOND .7 (GAUZE/BANDAGES/DRESSINGS) ×2
APL SKNCLS STERI-STRIP NONHPOA (GAUZE/BANDAGES/DRESSINGS)
APPLIER CLIP ROT 10 11.4 M/L (STAPLE) ×3
APR CLP MED LRG 11.4X10 (STAPLE) ×2
BENZOIN TINCTURE PRP APPL 2/3 (GAUZE/BANDAGES/DRESSINGS) ×1 IMPLANT
CANISTER SUCTION 2500CC (MISCELLANEOUS) ×2 IMPLANT
CANNULA ENDOPATH XCEL 11M (ENDOMECHANICALS) ×9 IMPLANT
CLAMP ENDO BABCK 10MM (STAPLE) ×2 IMPLANT
CLIP APPLIE ROT 10 11.4 M/L (STAPLE) ×2 IMPLANT
CLOTH BEACON ORANGE TIMEOUT ST (SAFETY) ×3 IMPLANT
DECANTER SPIKE VIAL GLASS SM (MISCELLANEOUS) ×5 IMPLANT
DERMABOND ADVANCED (GAUZE/BANDAGES/DRESSINGS) ×1
DERMABOND ADVANCED .7 DNX12 (GAUZE/BANDAGES/DRESSINGS) ×1 IMPLANT
DEVICE SUT QUICK LOAD TK 5 (STAPLE) ×6 IMPLANT
DEVICE SUT TI-KNOT TK 5X26 (MISCELLANEOUS) ×2 IMPLANT
DEVICE SUTURE ENDOST 10MM (ENDOMECHANICALS) ×3 IMPLANT
DISSECTOR BLUNT TIP ENDO 5MM (MISCELLANEOUS) ×1 IMPLANT
DRAIN PENROSE 18X1/2 LTX STRL (DRAIN) ×3 IMPLANT
DRAPE LAPAROSCOPIC ABDOMINAL (DRAPES) ×3 IMPLANT
ELECT REM PT RETURN 9FT ADLT (ELECTROSURGICAL) ×3
ELECTRODE REM PT RTRN 9FT ADLT (ELECTROSURGICAL) ×2 IMPLANT
GLOVE BIOGEL PI IND STRL 7.0 (GLOVE) ×2 IMPLANT
GLOVE BIOGEL PI INDICATOR 7.0 (GLOVE) ×1
GLOVE SURG SIGNA 7.5 PF LTX (GLOVE) ×3 IMPLANT
GOWN STRL NON-REIN LRG LVL3 (GOWN DISPOSABLE) ×5 IMPLANT
GOWN STRL REIN XL XLG (GOWN DISPOSABLE) ×6 IMPLANT
GRASPER ENDO BABCOCK 10 (MISCELLANEOUS) ×1 IMPLANT
GRASPER ENDO BABCOCK 10MM (MISCELLANEOUS)
KIT BASIN OR (CUSTOM PROCEDURE TRAY) ×3 IMPLANT
NS IRRIG 1000ML POUR BTL (IV SOLUTION) ×3 IMPLANT
PENCIL BUTTON HOLSTER BLD 10FT (ELECTRODE) IMPLANT
RETRACTOR LAPSCP 12X46 CVD (ENDOMECHANICALS) IMPLANT
RTRCTR LAPSCP 12X46 CVD (ENDOMECHANICALS)
SCALPEL HARMONIC ACE (MISCELLANEOUS) ×3 IMPLANT
SET IRRIG TUBING LAPAROSCOPIC (IRRIGATION / IRRIGATOR) ×3 IMPLANT
SOLUTION ANTI FOG 6CC (MISCELLANEOUS) ×3 IMPLANT
STAPLER VISISTAT 35W (STAPLE) ×3 IMPLANT
STRIP CLOSURE SKIN 1/2X4 (GAUZE/BANDAGES/DRESSINGS) IMPLANT
SUT ETHIBOND 2 0 SH (SUTURE) ×9
SUT ETHIBOND 2 0 SH 36X2 (SUTURE) ×3 IMPLANT
SUT MON AB 5-0 PS2 18 (SUTURE) ×2 IMPLANT
SUT SURGIDAC NAB ES-9 0 48 120 (SUTURE) ×12 IMPLANT
SUT VIC AB 4-0 SH 18 (SUTURE) ×1 IMPLANT
SUT VICRYL 0 TIES 12 18 (SUTURE) ×2 IMPLANT
SYR 5ML LL (SYRINGE) ×2 IMPLANT
TIP INNERVISION DETACH 40FR (MISCELLANEOUS) IMPLANT
TIP INNERVISION DETACH 50FR (MISCELLANEOUS) IMPLANT
TIP INNERVISION DETACH 56FR (MISCELLANEOUS) IMPLANT
TIPS INNERVISION DETACH 40FR (MISCELLANEOUS)
TOWEL OR 17X26 10 PK STRL BLUE (TOWEL DISPOSABLE) ×6 IMPLANT
TRAY FOLEY CATH 14FRSI W/METER (CATHETERS) ×3 IMPLANT
TRAY LAP CHOLE (CUSTOM PROCEDURE TRAY) ×3 IMPLANT
TROCAR BLADELESS OPT 5 75 (ENDOMECHANICALS) ×4 IMPLANT
TROCAR XCEL 12X100 BLDLESS (ENDOMECHANICALS) IMPLANT
TROCAR XCEL NON-BLD 11X100MML (ENDOMECHANICALS) ×1 IMPLANT
TROCAR Z-THREAD FIOS 11X100 BL (TROCAR) ×2 IMPLANT
TROCAR Z-THREAD FIOS 5X100MM (TROCAR) ×4 IMPLANT
TROCAR Z-THREAD SLEEVE 11X100 (TROCAR) ×4 IMPLANT
TUBING INSUFFLATION 10FT LAP (TUBING) ×3 IMPLANT

## 2011-07-17 NOTE — H&P (Signed)
Name:  Kerry Gomez MRN:  [id number]  ASSESSMENT AND PLAN:  1. S/P lap band, AP standard.   Successful weight loss of about 60 pounds. Initial weight - 282, BMI 38.2.   The patient has been plagued with GERD, probably secondary to a moderate HH above the lap band.  2. Reflux symptoms with hiatal hernia.   UGI 02/03/2011 shows a more dilated esophagus. The [facility] is not much changed. At the 02/17/2011 visit I discussed with him 2 things: 1) give him a band holiday to let the esophagus return to normal size, 2) resite the band and fix the [facility], to hopefully prevent future esophageal dilatation.   I removed 3.5 cc today from band (all the fluid) on 02/17/2011. He is doing much better with the fluid out of the band.   I have scheduled him for [facility] repair and re-siting the lap band on 05/16/2011. I had reviewed the surgery with him before and I went over it again. I discussed the risks, which include, bleeding, infection, failure to be able to resite the band, bowel injury, and recurrence of the [facility]. His surgery was delayed to 07/17/2011.  I thinks I answered all the questions about the surgery and the plan.   His wife is at the hospital with him today.  HISTORY OF PRESENT ILLNESS:   Kerry Gomez is a 51 y.o. (DOB: 1960-10-14) white male who is a patient of [facility] (the PA he was seeing has left) and comes to me today for lap band follow up.  He received an AP-Standard lap-band in May 07, 2006 by Dr. Colin Benton. I have seen him in Dr. Tami Lin absence.  I did an upper endo 11/16/2009.   He has a HH with increasing reflux we have tried to manage both medically and by adjusting the lap band.   The reflux continues to bother him. I repeated the UGI on 02/03/2011 which shows more dilated esophagus. The [facility] is not much changed. I think this pushes [place] to revise the lap band and try to fix the [facility].   I gave him a book on GERD (Nissan) at the last visit. He did put the head of his bed up, which helped a little. I  pointed out that a Nissen is different that resiting the lap band.   I took all the fluid out of his lap band on 02/17/2011 and he is doing much better. He is scheduled for [facility] repair and revision of Lap Band on 05/16/2011. Today's visit was to make sure his reflux was better and to go over the surgery.   ROS:  No significant cardiac, pulmonary, urologic, or musculoskeletal issues.   PHYSICAL EXAM:  BP 127/81  Pulse 69  Temp 98 F (36.7 C)  Resp 20  SpO2 100%  Ht 6' (1.829 m)  Wt 235 lb (106.595 kg)  BMI 31.87 kg/m2  General: [name] who is alert and generally healthy appearing.  HEENT: Normal. Pupils equal. Good dentition.  Neck: Supple. No mass. No thyroid mass. Carotid pulse okay with no bruit.  Lymph Nodes: No supraclavicular or cervical nodes.  Lungs: Clear to auscultation and symmetric breath sounds.  Heart: RRR. No murmur or rub.  Abdomen: Soft. No mass. No tenderness. No hernia. Normal bowel sounds. Lap band port in RUQ. He is happy with port placement and I told him that I do not plan to change the port.  Rectal: Not done.  Extremities: Good strength and ROM in  upper and lower extremities.  Neurologic: Grossly intact to motor and sensory function.  Psychiatric: Has normal mood and affect. Behavior is normal.   DATA REVIEWED:  UGI from 10/2009 and 12/2007.   [name], MD, [facility] Surgery  Pager: [phone]  Office phone: [phone]

## 2011-07-17 NOTE — Transfer of Care (Signed)
Immediate Anesthesia Transfer of Care Note  Patient: Kerry Gomez  Procedure(s) Performed: Procedure(s) (LRB): LAPAROSCOPIC REVISION OF GASTRIC BAND (N/A) LAPAROSCOPIC REPAIR OF HIATAL HERNIA (N/A)  Patient Location: PACU  Anesthesia Type: General  Level of Consciousness: awake, alert  and patient cooperative  Airway & Oxygen Therapy: Patient Spontanous Breathing and Patient connected to face mask oxygen  Post-op Assessment: Report given to PACU RN and Post -op Vital signs reviewed and stable  Post vital signs: Reviewed and stable  Complications: No apparent anesthesia complications

## 2011-07-17 NOTE — Anesthesia Postprocedure Evaluation (Signed)
  Anesthesia Post-op Note  Patient: Kerry Gomez  Procedure(s) Performed: Procedure(s) (LRB): LAPAROSCOPIC REVISION OF GASTRIC BAND (N/A) LAPAROSCOPIC REPAIR OF HIATAL HERNIA (N/A)  Patient Location: PACU  Anesthesia Type: General  Level of Consciousness: oriented and sedated  Airway and Oxygen Therapy: Patient Spontanous Breathing and Patient connected to nasal cannula oxygen  Post-op Pain: mild  Post-op Assessment: Post-op Vital signs reviewed, Patient's Cardiovascular Status Stable, Respiratory Function Stable and Patent Airway  Post-op Vital Signs: stable  Complications: No apparent anesthesia complications

## 2011-07-17 NOTE — Anesthesia Preprocedure Evaluation (Addendum)
Anesthesia Evaluation  Patient identified by MRN, date of birth, ID band Patient awake    Reviewed: Allergy & Precautions, H&P , NPO status , Patient's Chart, lab work & pertinent test results, reviewed documented beta blocker date and time   Airway Mallampati: II TM Distance: >3 FB Neck ROM: Full    Dental  (+) Teeth Intact and Dental Advisory Given   Pulmonary  Doesn't need  CPAP w/ wt loss breath sounds clear to auscultation        Cardiovascular negative cardio ROS  Rhythm:Regular Rate:Normal  Denies cardiac symptoms   Neuro/Psych negative neurological ROS  negative psych ROS   GI/Hepatic Neg liver ROS, GERD-  ,  Endo/Other  negative endocrine ROS  Renal/GU negative Renal ROS  negative genitourinary   Musculoskeletal negative musculoskeletal ROS (+)   Abdominal   Peds negative pediatric ROS (+)  Hematology negative hematology ROS (+)   Anesthesia Other Findings   Reproductive/Obstetrics negative OB ROS                           Anesthesia Physical Anesthesia Plan  ASA: II  Anesthesia Plan: General   Post-op Pain Management:    Induction: Intravenous and Cricoid pressure planned  Airway Management Planned: Oral ETT  Additional Equipment:   Intra-op Plan:   Post-operative Plan: Extubation in OR  Informed Consent: I have reviewed the patients History and Physical, chart, labs and discussed the procedure including the risks, benefits and alternatives for the proposed anesthesia with the patient or authorized representative who has indicated his/her understanding and acceptance.   Dental advisory given  Plan Discussed with: CRNA and Surgeon  Anesthesia Plan Comments:       Anesthesia Quick Evaluation

## 2011-07-17 NOTE — Brief Op Note (Signed)
07/17/2011  10:02 AM  PATIENT:  Kerry Gomez, 51 y.o., male, MRN: [id number]  PREOP DIAGNOSIS:  Slipped lap band with hiatal hernia  POSTOP DIAGNOSIS:   Slipped lap band with hiatal hernia  PROCEDURE:   Procedure(s): LAPAROSCOPIC REVISION (re-site) OF GASTRIC BAND,  LAPAROSCOPIC REPAIR OF HIATAL HERNIA  [photos in chart]  SURGEON:   [name], M.D.  ASSISTANT:   B. Hoxworth, M.D.  ANESTHESIA:   general  Jill Side, MD - Anesthesiologist Delphia Grates, CRNA - CRNA Illene Silver, CRNA - CRNA  General  EBL:  Minimal  ml  BLOOD ADMINISTERED: none  DRAINS: none   LOCAL MEDICATIONS USED:   28 cc 1/4% marcaine  SPECIMEN:   None  COUNTS CORRECT:  YES  INDICATIONS FOR PROCEDURE:  Kerry Gomez is a 51 y.o. (DOB: 1960-06-03) white male whose primary care physician is Provider Not In System and comes for repair of hiatal hernia and revision of lap band.   The indications and risks of the surgery were explained to the patient.  The risks include, but are not limited to, infection, bleeding, and nerve injury.  Note dictated to:   #[id number]

## 2011-07-17 NOTE — Preoperative (Signed)
Beta Blockers   Reason not to administer Beta Blockers:Not Applicable 

## 2011-07-18 ENCOUNTER — Ambulatory Visit (HOSPITAL_COMMUNITY): Payer: 59

## 2011-07-18 MED ORDER — OXYCODONE-ACETAMINOPHEN 5-325 MG/5ML PO SOLN: 5.0000 mL | ORAL | Status: DC | PRN

## 2011-07-18 NOTE — Progress Notes (Signed)
KUB results called to Dr. Ezzard Standing; pt alert and oriented; VSS; denies any nausea or vomiting; tolerating water well; denies burping or flatus or BM; voiding without difficulty; ambulating in hallways without difficulty; c/o some abdominal pain with relief from prn pain meds; pt aware of BELT program and support group; has follow up appt with Dr. Ezzard Standing; pt is a previous lap band and is aware of 2 week post op diet; encouraged to call if he feels like he needs to see Nutritionist and pt verbalized understanding of; discharge instructions reviewed and pt verbalized understanding of.  ADJUSTABLE GASTRIC BAND DISCHARGE INSTRUCTIONS  Drs. Fredrik Rigger, Hoxworth, Wilson, and [place] Call if you have any problems.   Call [phone] and ask for the surgeon on call.    If you need immediate assistance come to the ER at [facility]. Tell the ER personnel that you are a new post-op gastric banding patient. Signs and symptoms to report:   Severe vomiting or nausea. If you cannot tolerate clear liquids for longer than 1 day, you need to call your surgeon.    Abdominal pain which does not get better after taking your pain medication   Fever greater than 101 F degree   Difficulty breathing   Chest pain    Redness, swelling, drainage, or foul odor at incision sites    If your incisions open or pull apart   Swelling or pain in calf (lower leg)   Diarrhea, frequent watery, uncontrolled bowel movements.   Constipation, (no bowel movements for 3 days) if this occurs, Take Milk of Magnesia, 2 tablespoons by mouth, 3 times a day for 2 days if needed.  Call your doctor if constipation continues. Stop taking Milk of Magnesia once you have had a bowel movement. You may also use Miralax according to the label instructions.   Anything you consider "abnormal for you".   Normal side effects after Surgery:   Unable to sleep at night or concentrate   Irritability   Being tearful (crying) or depressed   These are common  complaints, possibly related to your anesthesia, stress of surgery and change in lifestyle, that usually go away a few weeks after surgery.  If these feelings continue, call your medical doctor.  Wound Care You may have surgical glue, steri-strips, or staples over your incisions after surgery.  Surgical glue:  Looks like a clear film over your incisions and will wear off gradually. Steri-strips: Strips of tape over your incisions. You may notice a yellowish color on the skin underneath the steri-strips. This is a substance used to make the steri-strips stick better. Do not pull the steri-strips off - let them fall off. Staples: Cherlynn Polo may be removed before you leave the hospital. If you go home with staples, call [facility] [phone]) for an appointment with your surgeon's nurse to have staples removed in 7 - 10 days. Showering: You may shower two days after your surgery unless otherwise instructed by your surgeon. Wash gently around wounds with warm soapy water, rinse well, and gently pat dry.  If you have a drain, you may need someone to hold this while you shower. Avoid tub baths until staples are removed and incisions are healed.    Medications   Medications should be liquid or crushed if larger than the size of a dime.  Extended release pills should not be crushed.   Depending on the size and number of medications you take, you may need to stagger/change the time you take  your medications so that you do not over-fill your pouch.    Make sure you follow-up with your primary care physician to make medication adjustments needed during rapid weight loss and life-style adjustment.   If you are diabetic, follow up with the doctor that prescribes your diabetes medication(s) within one week after surgery and check your blood sugar regularly.   Do not drive while taking narcotics!   Do not take acetaminophen (Tylenol) and Roxicet or Lortab Elixir at the same time since these pain  medications contain acetaminophen.  Diet at home: (First 2 Weeks)  You will see the nutritionist two weeks after your surgery. She will advance your diet if you are tolerating liquids well. Once at home, if you have severe vomiting or nausea and cannot tolerate clear liquids lasting longer than 1 day, call your surgeon.  For Same Day Surgery Discharge Patients: The day of surgery drink water only: 2 ounces every 4 hours. If you are tolerating water, begin drinking your high protein shake the next morning. For Overnight Stay Patients: Begin high protein shake 2 ounces every 3 hours, 5 - 6 times per day.  Gradually increase the amount you drink as tolerated.  You may find it easier to slowly sip shakes throughout the day.  It is important to get your proteins in first.   Protein Shake   Drink at least 2 ounces of shake 5-6 times per day   Each serving of protein shakes should have a minimum of 15 grams of protein and no more than 5 grams of carbohydrate    Increase the amount of protein shake you drink as tolerated   Protein powder may be added to fluids such as non-fat milk or Lactaid milk (limit to 20 grams added protein powder per serving   The initial goal is to drink at least 8 ounces of protein shake/drink per day (or as directed by the nutritionist). Some examples of protein shakes are [employer], [employer], EAS Edge HP, and Unjury. Hydration   Gradually increase the amount of water and other liquids as tolerated (See Acceptable Fluids)   Gradually increase the amount of protein shake as tolerated     Sip fluids slowly and throughout the day   May use Sugar substitutes, use sparingly (limit to 6 - 8 packets per day).  Your fluid goal is 64 ounces of fluid daily. It may take a few weeks to build up to this.         32 oz (or more) should be clear liquids and 32 oz (or more) should be full liquids.         Liquids should not contain sugar, caffeine, or carbonation!  Acceptable  Fluids Clear Liquids:   Water or Sugar-free flavored water, Fruit H2O   Decaffeinated coffee or tea (sugar-free)   Crystal Lite, Wyler's Lite, Minute Maid Lite   Sugar-free Jell-O   Bouillon or broth   Sugar-free Popsicle:   *Less than 20 calories each; Limit 1 per day   Full Liquids:              Protein Shakes/Drinks + 2 choices per day of other full liquids shown below.    Other full liquids must be: No more than 12 grams of Carbs per serving,  No more than 3 grams of Fat per serving   Strained low-fat cream soup   Non-Fat milk   Fat-free Lactaid Milk   Sugar-free yogurt (Dannon Lite & Fit) Vitamins and Minerals (  Start 1 day after surgery unless otherwise directed)   1 Chewable Multivitamin / Multimineral Supplement (i.e. Centrum for Adults)   Chewable Calcium Citrate with Vitamin D-3. Take 1500 mg each day.           (Example: 3 Chewable Calcium Plus 600 with Vitamin D-3 can be found at [facility])           Do not mix multivitamins containing iron with calcium supplements; take 2 hours   apart   Do not substitute Tums (calcium carbonate) for your calcium   Menstruating women and those at risk for anemia may need extra iron. Talk with your doctor to see if you need additional iron.     If you need extra iron:  Total daily Iron recommendations (including Vitamins) = 50 - 100 mg Iron/day Do not stop taking or change any vitamins or minerals until you talk to your nutritionist or surgeon. Your nutritionist and / or physician must approve all vitamin and mineral supplements. Exercise For maximum success, begin exercising as soon as your doctor recommends. Make sure your physician approves any physical activity.   Depending on fitness level, begin with a simple walking program   Walk 5-15 minutes each day, 7 days per week.    Slowly increase until you are walking 30-45 minutes per day   Consider joining our BELT program. [phone] or email belt@[employer] .edu Things to remember:   You may  have sexual relations when you feel comfortable. It is VERY important for male patients to use a reliable birth control method. Fertility often increases after surgery. Do not get pregnant for at least 18 months.   It is very important to keep all follow up appointments with your surgeon, nutritionist, primary care physician, and behavioral health practitioner. After the first year, please follow up with your bariatric surgeon at least once a year in order to maintain best weight loss results.  [facility]: [phone] [facility]: [phone]   Free counseling is available for you and your family through collaboration between [facility] and [place]. Please call [phone] and leave a message.    Consider purchasing a medical alert bracelet that says you had lap-band surgery.    The [facility] has a free Bariatric Surgery Support Group that meets monthly, the 3rd Thursday, 6 pm, Classroom #1, [place]. You may register online at [email or web address], but registration is not necessary. Select Classes and Support Groups, Bariatric Surgery, or Call [phone]   Do not return to work or drive until cleared by your surgeon   Use your CPAP when sleeping if applicable   Do not lift anything greater than ten pounds for at least two weeks.   You will probably have your first fill (fluid added to your band) 6 weeks after surgery  Talmadge Chad, RN Bariatric Nurse Coordinator

## 2011-07-18 NOTE — Op Note (Signed)
NAMEBURNARD, ENIS NO.:  [id number]  MEDICAL RECORD NO.:  [id number]  LOCATION:  1539                         FACILITY:  [facility]  PHYSICIAN:  Sandria Bales. Ezzard Standing, M.D.  DATE OF BIRTH:  Jun 08, 1960  DATE OF PROCEDURE:  17 July 2011                              OPERATIVE REPORT   PREOPERATIVE DIAGNOSIS:  Slipped Lap-Band with hiatal hernia.  POSTOPERATIVE DIAGNOSIS:  Slipped Lap-Band with hiatal hernia.  PROCEDURE:  Laparoscopic re-site of gastric band, laparoscopic repair of hiatal hernia (photos in chart).  SURGEON:  Sandria Bales. Ezzard Standing, MD  FIRST ASSISTANTSharlet Salina T. Hoxworth, MD  ANESTHESIA:  General endotracheal.  ESTIMATED BLOOD LOSS:  Minimal.  Local anesthetic was about 20 mL of 0.25% Marcaine.  COMPLICATIONS:  None.  INDICATION FOR PROCEDURE:  Mr. Cato is a 51 year old white male who has been seen by [facility] at [facility].  The PA he was seeing I think is left from there.  He had an AP standard Lap-Band placed by Dr. Baruch Merl on May 07, 2006.  He has had over the last couple of years trouble with reflux in the face of a hiatal hernia.  We tried to give him a Lap-Band vacation, which was only temporarily resolved his problems.  An upper GI had showed a small hiatal hernia and upper endoscopy suggest that he has a pouch of stomach above the band, this is a little more than should be, so he comes for attempted hiatal hernia repair with re-siting his band.  The indications and complications of surgery were explained to the patient.  The indications are potential complications include bleeding, infection, band problems, and the possibility this will fix his symptoms.  OPERATIVE NOTE:  The patient was in room #1 under general anesthesia supervised by Dr. Lestine Box.  His abdomen was prepped with ChloraPrep and sterilely draped.  A time-out was held and surgical checklist run.  I accessed the abdominal cavity through a left upper  quadrant 5-mm Optiview.  I had a total of 6 trocars sites, I had a 5-mm subxiphoid for the liver retractor, I had a 5-mm right subcostal for left hand, I had an 11-mm right paramedian for the Endo Stitch, I had a 5-mm left paramedian for the camera port and then I had a 5-mm left lateral subcostal for the assistance right hand.  Abdominal exploration revealed right and left lobes of liver unremarkable.  The stomach that I could see was unremarkable.  The bowel that I could see was unremarkable.  I turned my attention to the Lap Band, which was stuck on the undersurface of the left lobe of liver, but these adhesions were fairly minimal.  I spent over 15 minutes taking these adhesions down.  I then was able to isolate the band itself, I then unbuckled the band.  I divided the all 3 gastric plication sutures, which were lateral and led around to the left diaphragm/crus.  I then unbuckled the lap band and I had to cut the tubing beyond the little attachment device to unwrap the whole band.  I then turned my attention to looking for the hiatal hernia.  I dissected along the  right crus.  He did have a fullness in his hiatus consistent with probably some of his stomach pulled up to the chest.  I was able to get by when anteriorly dissecting the esophagus off anteriorly and right lateral and left.  I wanted to leave a little bit of the attachments along the left crus, so where it could hold the band in Position  Retrogastic I got below the esophagus, identified both the right and left crus.  His hiatus was probably about 5 cm plus.  I put two 2-0 Ethibond sutures with ties knots posteriorly.  This cinched the hiatus down to about 3 cm.  I then inserted the sizing balloon, inserted 15 mL of air, pulled this back and seen snug at the hiatus, so I thought I had at least reduced his hiatal hernia by about a third and I had a snugging up where the 15 mL balloon which got through.  I then  re-sited the Lap Band.  I went cranial to the old band and did look like he had a fair amount of stomach most likely and I got the dissection down.  It went posteriorly around without difficulty with finger.  I then passed the band around and I then brought the stomach below, the band actually had got in the bottom part of the track from the old Lap Band to above toward the new stomach, I used 3 imbricating stitches to hold the Lap Band in place.  So the band looked in place, it was mobile, but not looked too tight.  Photos were taken both of the posterior hiatal hernia of the band around the new re-siting location and had been sewn with imbrication and these were in the chart.  I then irrigated the wound.  I then brought out the two tubings through the trocar near his port site and reattached it using the little metal attachment device.  I then dropped this back into the abdominal cavity. I did inject a 5 mL of saline into the band to directly visualize, there was no leak of fluid from the band itself nor was any evidence of leak from the metal attachment device more proximally of the band.  I did inject 5cc in the lap band and saw no leak.  The band seemed lay well.  I removed all the trocars without difficulty.  The skin at each trocar site was closed with a 5-0 Monocryl suture, painted with Dermabond.  The patient tolerated the procedure well, was transported to the recovery room in good condition.  Sponge and needle count were correct at the end of the case.   Sandria Bales. Ezzard Standing, M.D., FACS   DHN/MEDQ  D:  07/17/2011  T:  07/17/2011  Job:  [id number]  cc:   [facility], [place]

## 2011-07-18 NOTE — Progress Notes (Signed)
General Surgery Note  LOS: 1 day  POD# 1  Assessment/Plan: 1. LAPAROSCOPIC REVISION OF GASTRIC BAND,  LAPAROSCOPIC REPAIR OF HIATAL HERNIA - D. Mays Paino - 07/17/2011  KUB looks good.  Tolerating po's  Home today.   2. GERD.  Hopefully will improve with HH repaired.  Subjective:  Has done well with liquids.  Home today.  Parents are in room Objective:   Filed Vitals:   28-Jul-2011 1000  BP: 116/65  Pulse: 78  Temp: 98 F (36.7 C)  Resp: 18     Intake/Output from previous day:  07/01 0701 - 28-Jul-2022 0700 In: 2072.1 [P.O.:120; I.V.:1952.1] Out: 2700 [Urine:2600; Blood:100]  Intake/Output this shift:  Total I/O In: 2633.3 [I.V.:2633.3] Out: 1150 [Urine:1150]   Physical Exam:   General: [name] who is alert and oriented.    HEENT: Normal. Pupils equal. .   Lungs: Clear   Abdomen: soft   Wound: Okay   Neurologic:  Grossly intact to motor and sensory function.   Psychiatric: Has normal mood and affect. Behavior is normal   Lab Results:   No results found for this basename: WBC:2,HGB:2,HCT:2,PLT:2 in the last 72 hours  BMET  No results found for this basename: NA:2,K:2,CL:2,CO2:2,GLUCOSE:2,BUN:2,CREATININE:2,CALCIUM:2 in the last 72 hours  PT/INR  No results found for this basename: LABPROT:2,INR:2 in the last 72 hours  ABG  No results found for this basename: PHART:2,PCO2:2,PO2:2,HCO3:2 in the last 72 hours   Studies/Results:  Dg Abd 1 View  28-Jul-2011  *RADIOLOGY REPORT*  Clinical Data: Laproscopic gastric banding  ABDOMEN - 1 VIEW  Comparison: 02/03/2011  Findings: Radiopaque Laproscopic band is positioned at the GE junction along the 2 o'clock to 8 o'clock plane.  Tube and reservoir appear intact.  Nonobstructive bowel gas pattern. Minimal basilar atelectasis.  Pelvic calcifications consistent with venous phleboliths.  No acute osseous finding.  IMPRESSION: Expected appearance status post Laproscopic gastric banding. Negative for obstruction.  Original Report Authenticated By: Judie Petit.  Ruel Favors, M.D.    Anti-infectives:   Anti-infectives     Start     Dose/Rate Route Frequency Ordered Stop   07/17/11 0530   ceFAZolin (ANCEF) IVPB 2 g/50 mL premix        2 g 100 mL/hr over 30 Minutes Intravenous  Once 07/17/11 0528 07/17/11 0731         [name], MD, FACS Pager: [phone],   [facility] Office: [phone] 07-28-2011

## 2011-07-18 NOTE — Discharge Instructions (Signed)
[facility] - DISCHARGE INSTRUCTIONS TO PATIENT  Return to work on:  Plan to return to work 08/07/2011  Activity:  Driving - May drive in 2 or 3 days, if doing well.   Lifting - No lifting > 15 pounds for 7 days, then as tolerated  Wound Care:   May shower starting tomorrow.  Diet:  Liquids (protein shakes) for 7 days.  Then slowly add solids back.  Follow up appointment:  Has an appt 7/18.  Call Dr. Allene Pyo office [facility]) at [phone] if question.  Medications and dosages:  Resume your home medications.  You have a prescription for:  Roxicet Elixir.  Call Dr. Ezzard Standing or his office  [phone]) if you have:  Temperature greater than 100.4,  Persistent nausea and vomiting,  Severe uncontrolled pain,  Redness, tenderness, or signs of infection (pain, swelling, redness, odor or green/yellow discharge around the site),  Difficulty breathing, headache or visual disturbances,  Any other questions or concerns you may have after discharge.  In an emergency, call 911 or go to an Emergency Department at a nearby hospital.

## 2011-07-18 NOTE — Progress Notes (Signed)
Patient tolerate shake & voice readiness to go home. Pain denied.

## 2011-07-19 ENCOUNTER — Encounter (HOSPITAL_COMMUNITY): Payer: Self-pay | Admitting: Surgery

## 2011-07-19 NOTE — Discharge Summary (Signed)
NAMELAMINE, LATON NO.:  [id number]  MEDICAL RECORD NO.:  [id number]  LOCATION:  1539                         FACILITY:  [facility]  PHYSICIAN:  Sandria Bales. Ezzard Standing, M.D.  DATE OF BIRTH:  1960-08-31  DATE OF ADMISSION:  07/17/2011 DATE OF DISCHARGE:  07/18/2011                              DISCHARGE SUMMARY   DISCHARGE DIAGNOSES: 1. Slipped lap band. 2. Reflux symptoms with hiatal hernia.  OPERATION PERFORMED:  The patient underwent a laparoscopic revision re-site of gastric band, laparoscopic repair of hiatal hernia on July 17, 2011, and that was by Dr. [name].  HISTORY OF PRESENT ILLNESS:  Mr. Bonet is a 51 year old white male, who is a patient at [facility], though he says that PA has been seen and he has left.  He had a lap band, AP standard by Dr. Baruch Merl on May 07, 2006.  He has done well with weight loss, which started around 290, got down to about 210 pounds; however, over the last year and a half, he has had increasing trouble with reflux.  He had an upper GI on January 27, 2011, which showed some esophageal dilatation and a small hiatal hernia.  He has undergone upper endoscopy by me, which showed prominent and more significant amount of stomach above the band.  We tried giving him holidays from his lap band, but his symptoms all have recurred once he started tightening the band down.  Therefore, I spent a long time talking to him about the options for treating this.  1. To remove the band entirely. 2. Continue giving him band holidays and tighten the band as     tolerated. 3. I thought about possibly revising his band and doing  2 things, trying to fix the hiatal hernia he has.  Re-siting the band more proximally which will hopefully provide less of the stomach pouch that could get dilated.  We will go ahead with this option #3 of trying to fix the hiatal hernia and re-site the band.  HOSPITAL COURSE:  He was taken to the OR on the  day of admission on July 17, 2011.  Laparoscopically I took his band down and re-sited the band and I fixed a hiatal hernia on him.  Postoperatively, he has done well.  He has tolerated liquids.  He will be advanced to protein drinks.  He is now ready for discharge.  DISCHARGE INSTRUCTIONS:  He is given Roxicet Elixir for pain.   He will be out of work throughout August 07, 2011.   He is going to see me back, I think, July 18.   He should be on liquids for about several days and slowly advance solids back.  He has already been through the lap band course before, so he has a pretty good understanding about diet management.  He can shower starting tomorrow.  He is to call for any interval Problem.   Sandria Bales. Ezzard Standing, M.D., FACS  DHN/MEDQ  D:  07/18/2011  T:  07/18/2011  Job:  [id number]  cc:   [facility]

## 2011-08-03 ENCOUNTER — Ambulatory Visit (INDEPENDENT_AMBULATORY_CARE_PROVIDER_SITE_OTHER): Payer: 59 | Admitting: Surgery

## 2011-08-03 ENCOUNTER — Encounter (INDEPENDENT_AMBULATORY_CARE_PROVIDER_SITE_OTHER): Payer: Self-pay | Admitting: Surgery

## 2011-08-03 VITALS — BP 130/90 | HR 68 | Temp 98.1°F | Resp 16 | Ht 72.0 in | Wt 228.6 lb

## 2011-08-03 DIAGNOSIS — Z9884 Bariatric surgery status: Secondary | ICD-10-CM

## 2011-08-03 NOTE — Progress Notes (Signed)
ASSESSMENT AND PLAN: 1.  S/P lap band, AP standard.  Orignially placed by Dr. Colin Benton - 05/07/2006.  Revised by Dr. Ezzard Standing - 07/17/2011.  [Pictures in chart under "media" - 07/19/2011]  Successful weight loss of about 60 pounds.  Initial weight - 282, BMI 38.2.  Doing well.  Today was a post op visit.  I will see him back in 6 to 8 weeks and probably do a lap band fill.   2.  Hiatal hernia repair - 07/17/2011 - see above 3.  I wrote a return to work 08/07/2011.   HISTORY OF PRESENT ILLNESS:  Kerry Gomez is a 51 y.o. (DOB: 03/20/1960)  white male who is a patient of Kerry Gomez at [facility] (the PA he was seeing has left) and comes to me today for lap band follow up.   He received an AP-Standard lap-band in May 07, 2006 by Dr. Colin Benton.  I have seen him in Dr. Tami Lin absence.   He had a slipped lap band with HH which I repaired 07/17/2011.  His reflux symptoms resolved when I pulled all the fluid out of the lap band.  He has watched his weight some and lost a little since before surgery.  He wants to play golf, which is okay. ROS: No significant cardiac, pulmonary, urologic, or musculoskeletal issues.  PHYSICAL EXAM: BP 130/90  Pulse 68  Temp 98.1 F (36.7 C) (Temporal)  Resp 16  Ht 6' (1.829 m)  Wt 228 lb 9.6 oz (103.692 kg)  BMI 31.00 kg/m2  General: [name] who is alert and generally healthy appearing.  Lungs: Clear to auscultation and symmetric breath sounds. Heart:  RRR. No murmur or rub. Abdomen: Soft. No mass. No tenderness.  Incisions look good.  Normal BS.  DATA REVIEWED: None new.  [name], MD, [facility] Pager: [phone] Office phone:  [phone]

## 2011-09-26 ENCOUNTER — Encounter (INDEPENDENT_AMBULATORY_CARE_PROVIDER_SITE_OTHER): Payer: Self-pay | Admitting: Surgery

## 2011-09-26 ENCOUNTER — Ambulatory Visit (INDEPENDENT_AMBULATORY_CARE_PROVIDER_SITE_OTHER): Payer: 59 | Admitting: Surgery

## 2011-09-26 VITALS — BP 138/90 | HR 84 | Resp 16 | Ht 72.0 in | Wt 229.6 lb

## 2011-09-26 DIAGNOSIS — Z9884 Bariatric surgery status: Secondary | ICD-10-CM

## 2011-09-26 NOTE — Progress Notes (Signed)
ASSESSMENT AND PLAN: 1.  S/P lap band, AP standard.  Orignially placed by Dr. Colin Benton - 05/07/2006.  Revised by Dr. Ezzard Standing - 07/17/2011.  [Pictures in chart under "media" - 07/19/2011]  Successful weight loss of about 60 pounds.  Initial weight - 282, BMI 38.2.  I put 3.2 cc of fluid in his band for a total of 4.2 cc.  I will see him back in 8 weeks.  2.  Hiatal hernia repair - 07/17/2011 - see above 3.  I wrote a return to work 08/07/2011.   HISTORY OF PRESENT ILLNESS:  Kerry Gomez is a 51 y.o. (DOB: 12-21-1960)  white male who is a patient of [facility] (the PA he was seeing has left) and comes to me today for lap band follow up.   He has done well since surgery and has maintained his weight. He has had no nausea or vomiting.  He is not having any reflux.  And he almost has stopped taking his Protonix.    For exercise -  He is walking about 1 to 1 1/2 miles every other day.  I have encouraged him to increase to 3 miles 5 times per week, but he said that it is doubtful that he will be able to do that.  Lap Band History:  He received an AP-Standard lap-band in May 07, 2006 by Dr. Colin Benton.  I have seen him in Dr. Tami Lin absence.   He had a slipped lap band with HH which I repaired 07/17/2011.  His reflux symptoms resolved when I pulled all the fluid out of the lap band.  And he has had no reflux symptoms since surgery.   ROS: No significant cardiac, pulmonary, urologic, or musculoskeletal issues.  PHYSICAL EXAM: BP 138/90  Pulse 84  Resp 16  Ht 6' (1.829 m)  Wt 229 lb 9.6 oz (104.146 kg)  BMI 31.14 kg/m2  General: [name] who is alert and generally healthy appearing.  Lungs: Clear to auscultation and symmetric breath sounds. Heart:  RRR. No murmur or rub. Abdomen: Soft. No mass. No tenderness.  Incisions look good.  Normal BS.  Procedure:  I access his lap band.  He had about 1.0 cc of fluid in the lap band.  I added 3.2 cc for a total of 4.2 cc.  He tolerated water.  DATA  REVIEWED: None new.  [name], MD, [facility] Pager: [phone] Office phone:  [phone]

## 2011-12-06 ENCOUNTER — Encounter (INDEPENDENT_AMBULATORY_CARE_PROVIDER_SITE_OTHER): Payer: Self-pay | Admitting: Surgery

## 2011-12-06 ENCOUNTER — Ambulatory Visit (INDEPENDENT_AMBULATORY_CARE_PROVIDER_SITE_OTHER): Payer: 59 | Admitting: Surgery

## 2011-12-06 VITALS — BP 145/93 | HR 82 | Temp 97.8°F | Resp 16 | Ht 72.0 in | Wt 234.6 lb

## 2011-12-06 DIAGNOSIS — Z9884 Bariatric surgery status: Secondary | ICD-10-CM

## 2011-12-06 NOTE — Progress Notes (Signed)
ASSESSMENT AND PLAN: 1.  S/P lap band, AP standard.  Orignially placed by Dr. Colin Benton - 05/07/2006.  Revised by Dr. Ezzard Standing - 07/17/2011.  [Pictures in chart under "media" - 07/19/2011]  Initial weight - 282, BMI 38.2.  I put 2.0 cc of fluid in his band for a total of 5.2 cc.  I will see him back in 8 - 12 weeks.  He can see Mardelle Matte or me for his next visit.  2.  Hiatal hernia repair - 07/17/2011 - see above 3.  I wrote a return to work 08/07/2011.   HISTORY OF PRESENT ILLNESS:  Kerry Gomez is a 51 y.o. (DOB: 1960/05/01)  white male who is a patient of [facility] (the PA he was seeing has left) and comes to me today for lap band follow up.   He has done well since I last saw.  He said he felt restriction for a while, but now does not feel any.  He also recently went to the beach with some male friends, so that did not help his weight.  He is actually going back to the beach with his family over Thanksgiving, but he will behave better this time.  He has had not nausea or vomiting.   For exercise -  He is walking about 1 to 1 1/2 miles every other day.  I have encouraged him to increase to 3 miles 5 times per week, but he said that it is doubtful that he will be able to do that.  Lap Band History:  He received an AP-Standard lap-band in May 07, 2006 by Dr. Colin Benton.  I have seen him in Dr. Tami Lin absence.   He had a slipped lap band with HH which I repaired 07/17/2011.  His reflux symptoms resolved when I pulled all the fluid out of the lap band.  And he has had no reflux symptoms since surgery.   ROS: No significant cardiac, pulmonary, urologic, or musculoskeletal issues.  PHYSICAL EXAM: BP 145/93  Pulse 82  Temp 97.8 F (36.6 C) (Temporal)  Resp 16  Ht 6' (1.829 m)  Wt 234 lb 9.6 oz (106.414 kg)  BMI 31.82 kg/m2  General: [name] who is alert and generally healthy appearing.  Lungs: Clear to auscultation and symmetric breath sounds. Heart:  RRR. No murmur or rub. Abdomen: Soft. No  mass. No tenderness.  Incisions look good.  Normal BS.  Procedure:  I accessed his lap band.  He had 3.2 cc of fluid in the lap band.  I added 2.0 cc for a total of 5.2 cc.  He tolerated water.  DATA REVIEWED: None new.  Kerry Kin, MD, [facility] Pager: [phone] Office phone:  [phone]

## 2011-12-07 ENCOUNTER — Encounter (INDEPENDENT_AMBULATORY_CARE_PROVIDER_SITE_OTHER): Payer: Self-pay | Admitting: Surgery

## 2011-12-07 ENCOUNTER — Ambulatory Visit (INDEPENDENT_AMBULATORY_CARE_PROVIDER_SITE_OTHER): Payer: 59 | Admitting: Surgery

## 2011-12-07 VITALS — BP 136/78 | HR 78 | Temp 97.6°F | Resp 18 | Ht 72.0 in | Wt 228.0 lb

## 2011-12-07 DIAGNOSIS — Z9884 Bariatric surgery status: Secondary | ICD-10-CM

## 2011-12-07 NOTE — Progress Notes (Signed)
See note 12/05/2101.  Patient has not been able to keep liquids down since yesterday.  I accessed his lap band.  I removed 0.8 cc for a total of 4.4 cc.  He tolerated water.  His return will be as already scheduled.  [name], MD, [facility] Pager: [phone] Office phone:  [phone]

## 2012-01-04 ENCOUNTER — Encounter (INDEPENDENT_AMBULATORY_CARE_PROVIDER_SITE_OTHER): Payer: Self-pay

## 2012-01-04 ENCOUNTER — Ambulatory Visit (INDEPENDENT_AMBULATORY_CARE_PROVIDER_SITE_OTHER): Payer: 59 | Admitting: Physician Assistant

## 2012-01-04 VITALS — BP 122/84 | HR 72 | Temp 97.3°F | Resp 18 | Ht 72.0 in | Wt 224.2 lb

## 2012-01-04 DIAGNOSIS — Z4651 Encounter for fitting and adjustment of gastric lap band: Secondary | ICD-10-CM

## 2012-01-04 NOTE — Patient Instructions (Signed)
Return in one month. Focus on good food choices as well as physical activity. Return sooner if you have an increase in hunger, portion sizes or weight. Return also for difficulty swallowing, night cough, reflux.   

## 2012-01-04 NOTE — Progress Notes (Signed)
  HISTORY: Kerry Gomez is a 51 y.o.male who received an AP-Standard lap-band in April 2008 with a revision in July 2013 by Dr. Ezzard Standing. He was seen on 11/21, the day following a 2 mL fill, with liquid intolerance. 0.8 mL was removed but unfortunately, he has persistent GERD symptoms with some slow transit of liquids. He would like some more fluid removed. He has been taking Protonix nightly and prilosec every am.  VITAL SIGNS: Filed Vitals:   01/04/12 1437  BP: 122/84  Pulse: 72  Temp: 97.3 F (36.3 C)  Resp: 18    PHYSICAL EXAM: Physical exam reveals a very well-appearing 51 y.o.male in no apparent distress Neurologic: Awake, alert, oriented Psych: Bright affect, conversant Respiratory: Breathing even and unlabored. No stridor or wheezing Abdomen: Soft, nontender, nondistended to palpation. Incisions well-healed. No incisional hernias. Port easily palpated. Extremities: Atraumatic, good range of motion.  ASSESMENT: 51 y.o.  male  s/p AP-Standard lap-band.   PLAN: The patient's port was accessed with a 20G Huber needle without difficulty. Clear fluid was aspirated and 1 mL saline was removed from the port after which he was able to take water with far greater ease. I asked that if he had recurrent GERD, to call Lawson Fiscal T and we'd schedule him for an upper GI. Otherwise we'll have him back in one month.

## 2012-02-01 ENCOUNTER — Ambulatory Visit (INDEPENDENT_AMBULATORY_CARE_PROVIDER_SITE_OTHER): Payer: 59 | Admitting: Physician Assistant

## 2012-02-01 ENCOUNTER — Encounter (INDEPENDENT_AMBULATORY_CARE_PROVIDER_SITE_OTHER): Payer: Self-pay

## 2012-02-01 VITALS — BP 132/84 | HR 78 | Resp 16 | Ht 72.0 in | Wt 232.0 lb

## 2012-02-01 DIAGNOSIS — Z4651 Encounter for fitting and adjustment of gastric lap band: Secondary | ICD-10-CM

## 2012-02-01 NOTE — Patient Instructions (Signed)
Take clear liquids tonight. Thin protein shakes are ok to start tomorrow morning. Slowly advance your diet thereafter. Call us if you have persistent vomiting or regurgitation, night cough or reflux symptoms. Return as scheduled or sooner if you notice no changes in hunger/portion sizes.  

## 2012-02-01 NOTE — Progress Notes (Signed)
  HISTORY: Kerry Gomez is a 52 y.o.male who received an AP-Standard lap-band in April 2008 by Dr. Colin Benton followed by a revision in July 2013 by Dr. Ezzard Standing. He comes in with no further reflux or regurgitation since 1 mL was removed in December. He does admit to increased hunger and portions with an associated 7 lb weight gain.  VITAL SIGNS: Filed Vitals:   02/01/12 1005  BP: 132/84  Pulse: 78  Resp: 16    PHYSICAL EXAM: Physical exam reveals a very well-appearing 51 y.o.male in no apparent distress Neurologic: Awake, alert, oriented Psych: Bright affect, conversant Respiratory: Breathing even and unlabored. No stridor or wheezing Abdomen: Soft, nontender, nondistended to palpation. Incisions well-healed. No incisional hernias. Port easily palpated. Extremities: Atraumatic, good range of motion.  ASSESMENT: 52 y.o.  male  s/p AP-Standard lap-band.   PLAN: The patient's port was accessed with a 20G Huber needle without difficulty. Clear fluid was aspirated and 0.5 mL saline was added to the port to give a total predicted volume of 3.9 mL. The patient was able to swallow water without difficulty following the procedure and was instructed to take clear liquids for the next 24-48 hours and advance slowly as tolerated.

## 2012-02-15 ENCOUNTER — Encounter (INDEPENDENT_AMBULATORY_CARE_PROVIDER_SITE_OTHER): Payer: 59

## 2012-05-02 ENCOUNTER — Encounter (INDEPENDENT_AMBULATORY_CARE_PROVIDER_SITE_OTHER): Payer: Self-pay

## 2012-05-02 ENCOUNTER — Ambulatory Visit (INDEPENDENT_AMBULATORY_CARE_PROVIDER_SITE_OTHER): Payer: 59 | Admitting: Physician Assistant

## 2012-05-02 VITALS — BP 124/86 | HR 85 | Resp 16 | Ht 72.0 in | Wt 239.2 lb

## 2012-05-02 DIAGNOSIS — Z4651 Encounter for fitting and adjustment of gastric lap band: Secondary | ICD-10-CM

## 2012-05-02 NOTE — Progress Notes (Signed)
  HISTORY: Kerry Gomez is a 52 y.o.male who received an AP-Standard lap-band in April 2008 by Dr. Colin Benton with revision for slip and [facility] repair by Dr. Ezzard Standing in July 2013. He comes in with complaints of reflux since his last visit in January. He had a 0.5 mL fill then. He has gained 7 lbs but he says recently he lost 5 lbs. He attributes this to reduced exercise and poor food choices. He wants some fluid removed to help with his nocturnal reflux.  VITAL SIGNS: Filed Vitals:   05/02/12 0919  BP: 124/86  Pulse: 85  Resp: 16    PHYSICAL EXAM: Physical exam reveals a very well-appearing 52 y.o.male in no apparent distress Neurologic: Awake, alert, oriented Psych: Bright affect, conversant Respiratory: Breathing even and unlabored. No stridor or wheezing Abdomen: Soft, nontender, nondistended to palpation. Incisions well-healed. No incisional hernias. Port easily palpated. Extremities: Atraumatic, good range of motion.  ASSESMENT: 52 y.o.  male  s/p AP-Standard lap-band.   PLAN: The patient's port was accessed with a 20G Huber needle without difficulty. Clear fluid was aspirated and 0.25 mL saline was removed from the port to give a total predicted volume of 3.65 mL. The patient was advised to concentrate on healthy food choices and to avoid slider foods high in fats and carbohydrates. I asked him to return in one month. If he still has symptoms next week, I asked that he call us then.

## 2012-05-02 NOTE — Patient Instructions (Signed)
Return in one month. Focus on good food choices as well as physical activity. Return sooner if you have an increase in hunger, portion sizes or weight. Return also for difficulty swallowing, night cough, reflux.   

## 2012-05-30 ENCOUNTER — Encounter (INDEPENDENT_AMBULATORY_CARE_PROVIDER_SITE_OTHER): Payer: 59

## 2013-02-06 ENCOUNTER — Ambulatory Visit (INDEPENDENT_AMBULATORY_CARE_PROVIDER_SITE_OTHER): Payer: 59 | Admitting: Physician Assistant

## 2013-02-06 ENCOUNTER — Encounter (INDEPENDENT_AMBULATORY_CARE_PROVIDER_SITE_OTHER): Payer: Self-pay

## 2013-02-06 VITALS — BP 123/70 | HR 68 | Temp 98.6°F | Resp 14 | Ht 72.0 in | Wt 250.2 lb

## 2013-02-06 DIAGNOSIS — Z4651 Encounter for fitting and adjustment of gastric lap band: Secondary | ICD-10-CM

## 2013-02-06 NOTE — Patient Instructions (Signed)

## 2013-02-06 NOTE — Progress Notes (Signed)
  HISTORY: Kerry Gomez is a 53 y.o.male who received an AP-Standard lap-band in April 2008 by Dr. Colin BentonEarle. He comes in with 11 lbs weight gain since his last visit when 0.25 mL was removed due to reflux symptoms. He has no further reflux but he does complain of increased hunger and portion sizes. He walks regularly, especially if he plays golf (9 holes only). He has lost 32 lbs since surgery.  VITAL SIGNS: Filed Vitals:   02/06/13 1156  BP: 123/70  Pulse: 68  Temp: 98.6 F (37 C)  Resp: 14    PHYSICAL EXAM: Physical exam reveals a very well-appearing 52 y.o.male in no apparent distress Neurologic: Awake, alert, oriented Psych: Bright affect, conversant Respiratory: Breathing even and unlabored. No stridor or wheezing Abdomen: Soft, nontender, nondistended to palpation. Incisions well-healed. No incisional hernias. Port easily palpated. Extremities: Atraumatic, good range of motion.  ASSESMENT: 53 y.o.  male  s/p AP-Standard lap-band.   PLAN: The patient's port was accessed with a 20G Huber needle without difficulty. Clear fluid was aspirated and 0.2 mL saline was added to the port to give a total predicted volume of 3.85 mL. The patient was able to swallow water without difficulty following the procedure and was instructed to take clear liquids for the next 24-48 hours and advance slowly as tolerated.

## 2013-03-26 IMAGING — CR DG ABDOMEN 1V
2 series · 2 of 2 positions shown · non-contrast
Comparison: 02/03/2011

CLINICAL DATA: Laproscopic gastric banding

ABDOMEN - 1 VIEW

[t abdomen supine (1 of 2)]
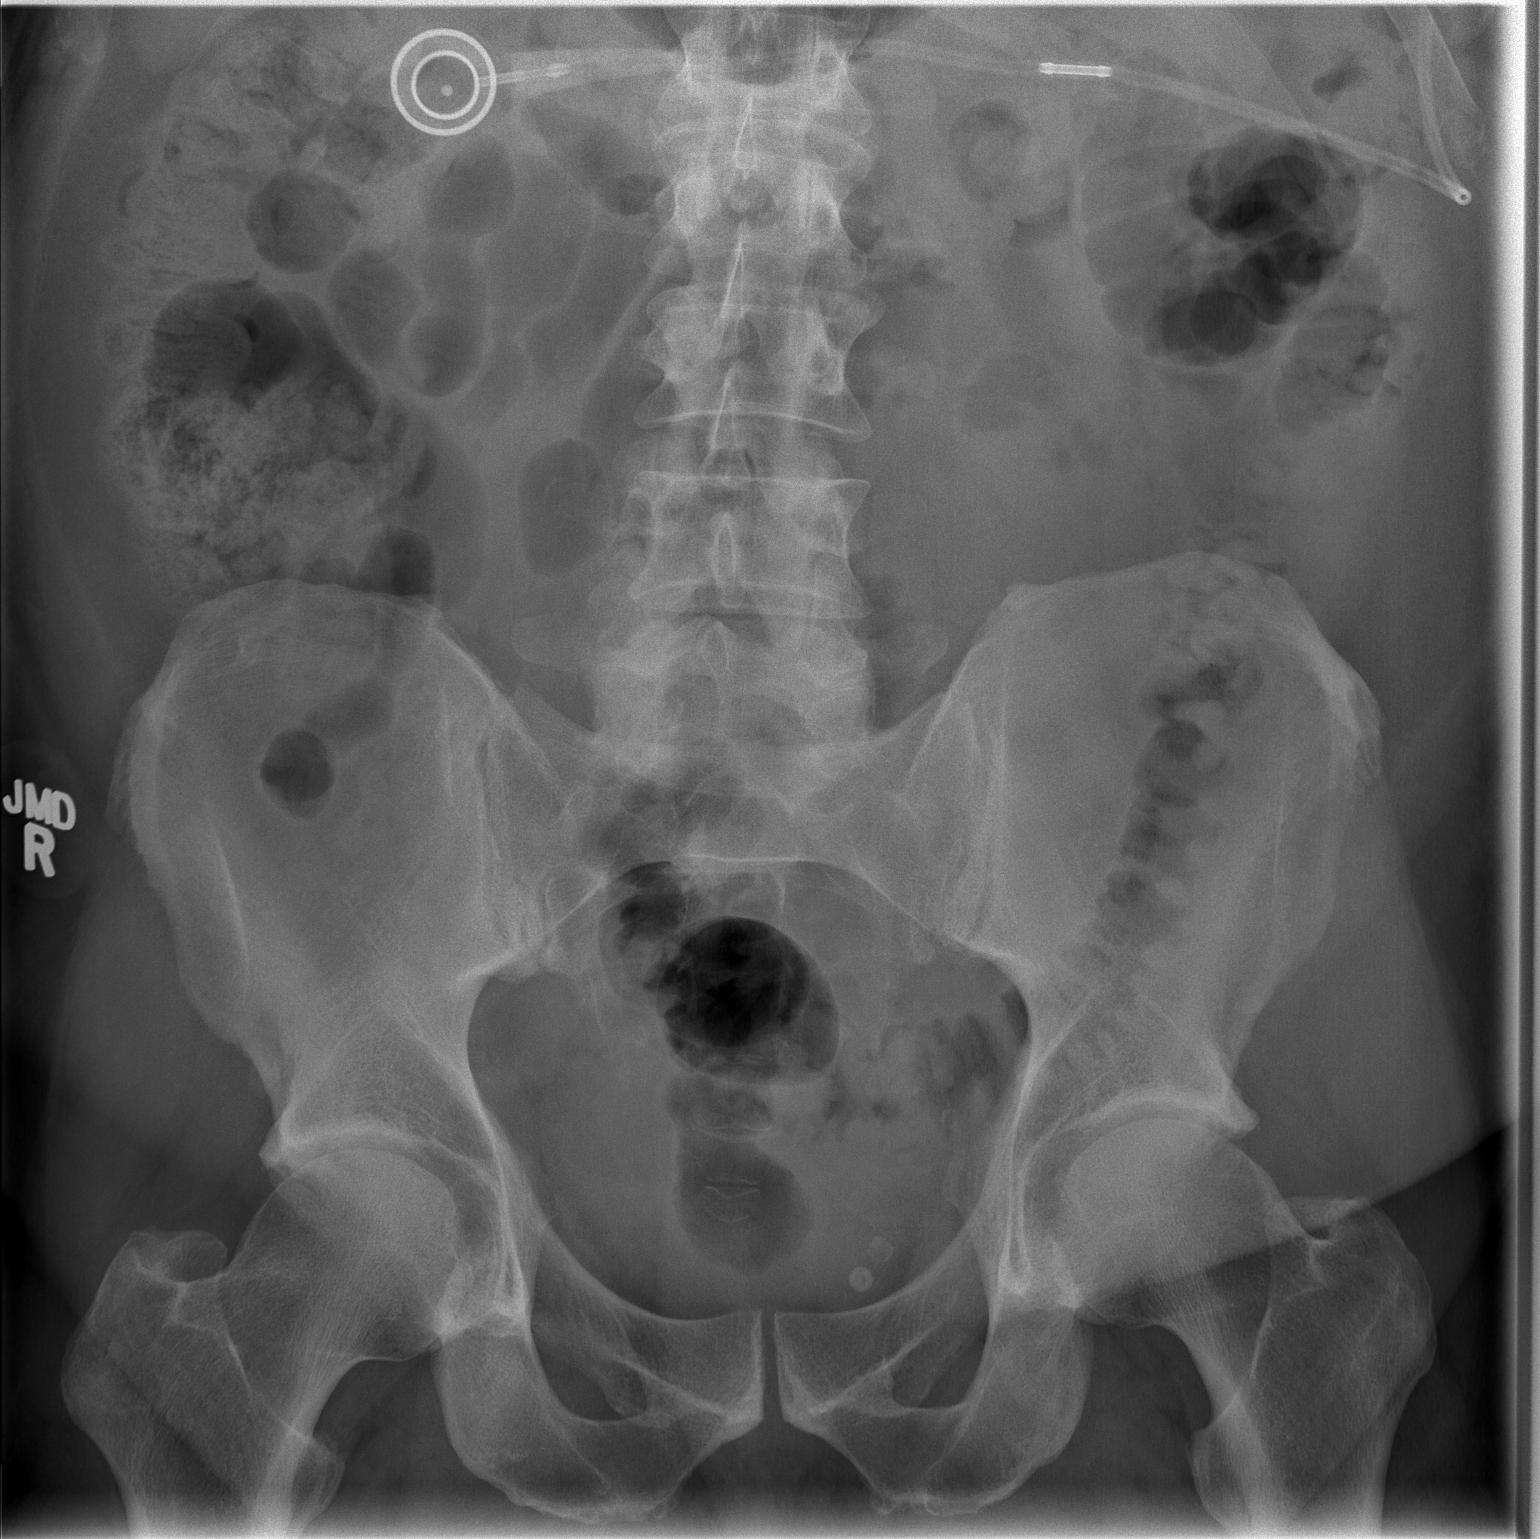

[t abdomen supine (2 of 2)]
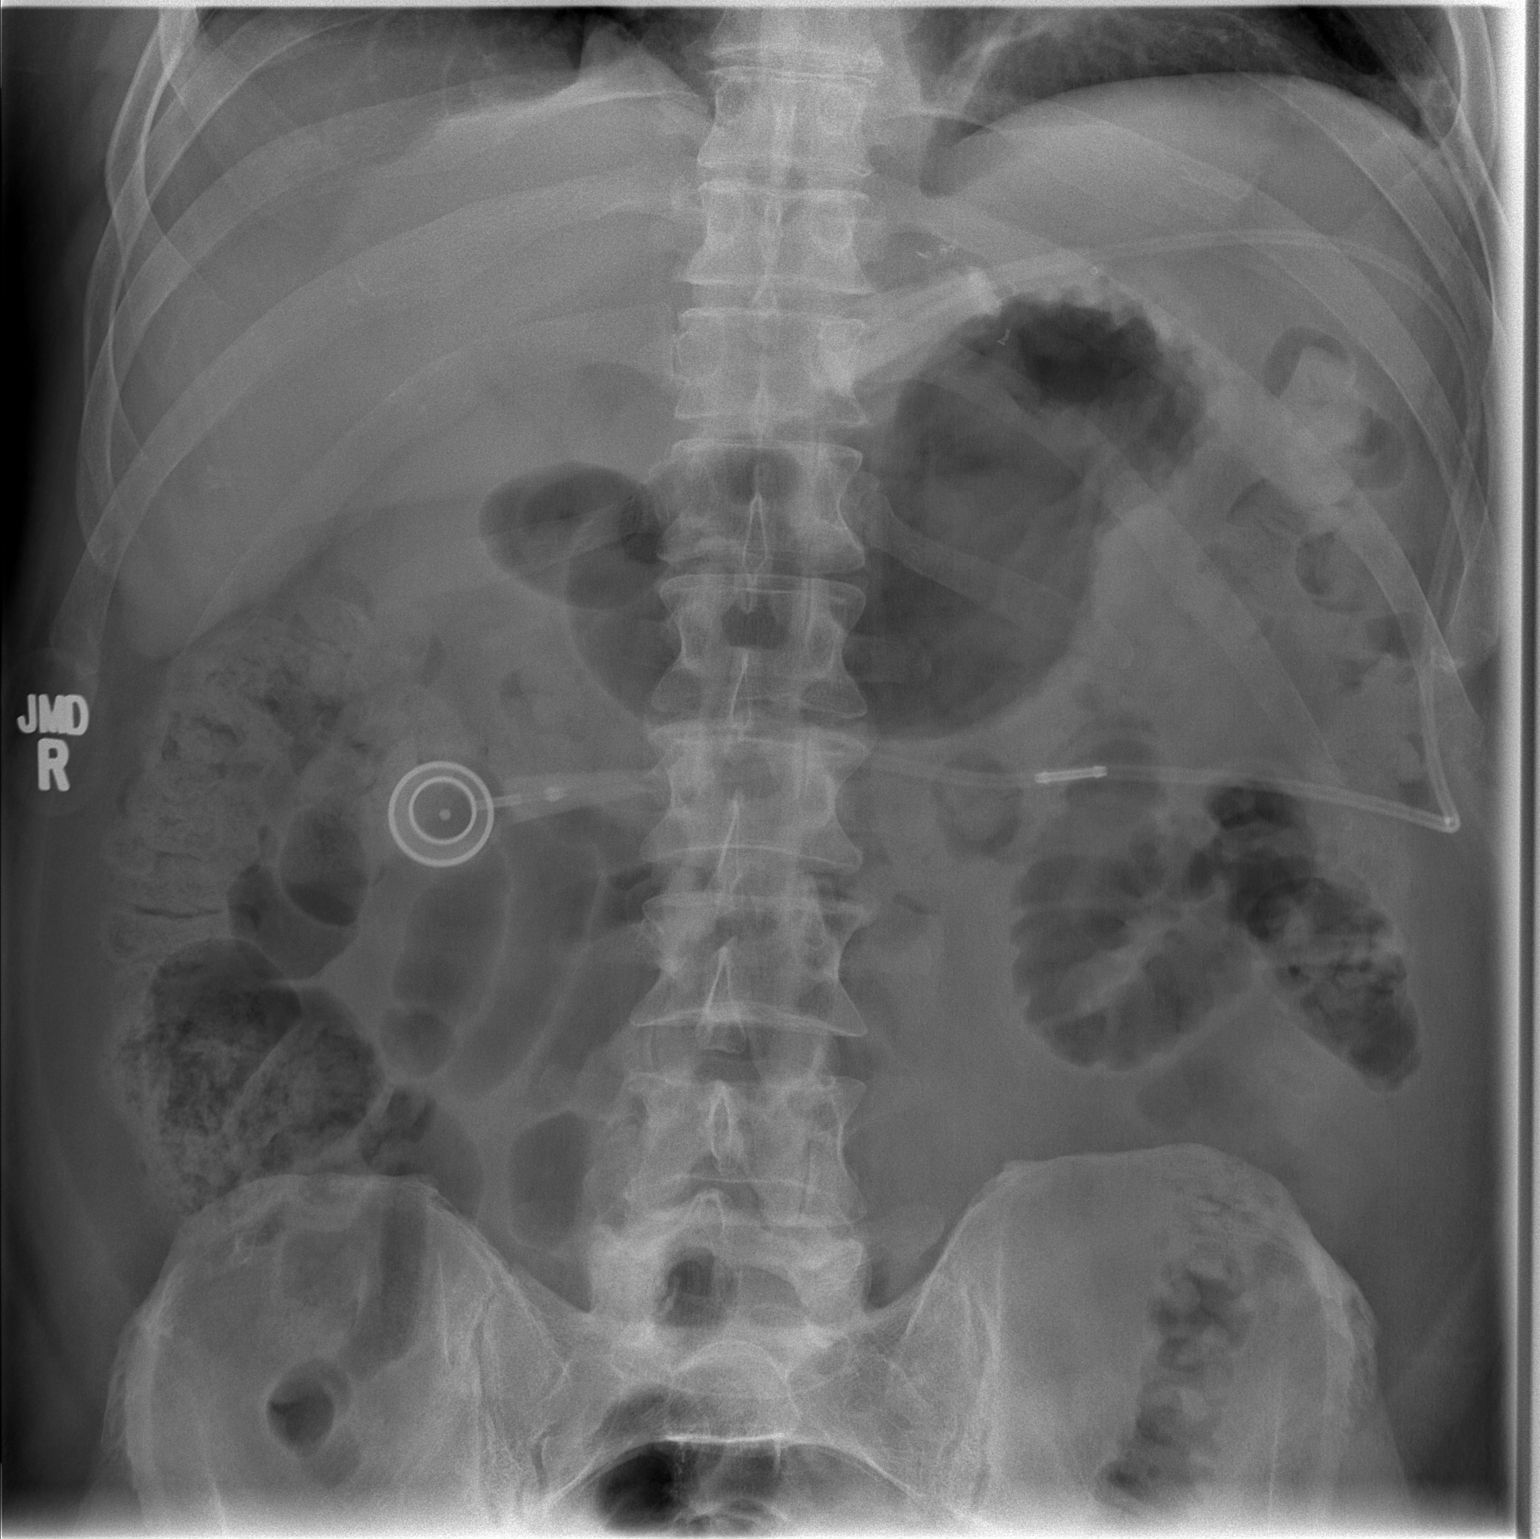

[2 of 2 positions shown; findings below may reference images not displayed]

FINDINGS: Radiopaque Laproscopic band is positioned at the GE
junction along the 2 o'clock to 8 o'clock plane.  Tube and
reservoir appear intact.  Nonobstructive bowel gas pattern.
Minimal basilar atelectasis.  Pelvic calcifications consistent with
venous phleboliths.  No acute osseous finding.
IMPRESSION: Expected appearance status post Laproscopic gastric banding.
Negative for obstruction.

## 2014-01-29 ENCOUNTER — Encounter (HOSPITAL_COMMUNITY): Payer: Self-pay | Admitting: Surgery
